# Patient Record
Sex: Female | Born: 1945 | Race: White | Hispanic: No | Marital: Married | Smoking: Former smoker
Health system: Southern US, Community
[De-identification: ages and names within clinical notes are randomized; demographics above are authoritative.]

## PROBLEM LIST (undated history)

## (undated) DIAGNOSIS — I2510 Atherosclerotic heart disease of native coronary artery without angina pectoris: Secondary | ICD-10-CM

## (undated) DIAGNOSIS — I10 Essential (primary) hypertension: Secondary | ICD-10-CM

## (undated) DIAGNOSIS — M5126 Other intervertebral disc displacement, lumbar region: Secondary | ICD-10-CM

## (undated) DIAGNOSIS — J302 Other seasonal allergic rhinitis: Secondary | ICD-10-CM

## (undated) DIAGNOSIS — K5900 Constipation, unspecified: Secondary | ICD-10-CM

## (undated) DIAGNOSIS — K219 Gastro-esophageal reflux disease without esophagitis: Secondary | ICD-10-CM

## (undated) DIAGNOSIS — M62830 Muscle spasm of back: Secondary | ICD-10-CM

## (undated) DIAGNOSIS — I639 Cerebral infarction, unspecified: Secondary | ICD-10-CM

## (undated) DIAGNOSIS — R51 Headache: Secondary | ICD-10-CM

## (undated) HISTORY — PX: COLONOSCOPY: SHX174

## (undated) HISTORY — PX: POSTERIOR LAMINECTOMY / DECOMPRESSION CERVICAL SPINE: SUR739

## (undated) HISTORY — PX: EYE SURGERY: SHX253

---

## 2001-07-03 ENCOUNTER — Ambulatory Visit (HOSPITAL_COMMUNITY): Admission: EM | Admit: 2001-07-03 | Discharge: 2001-07-03 | Payer: Self-pay | Admitting: Obstetrics and Gynecology

## 2001-07-03 ENCOUNTER — Encounter: Payer: Self-pay | Admitting: Obstetrics and Gynecology

## 2001-08-25 ENCOUNTER — Ambulatory Visit (HOSPITAL_COMMUNITY): Admission: RE | Admit: 2001-08-25 | Discharge: 2001-08-25 | Payer: Self-pay | Admitting: Family Medicine

## 2001-08-25 ENCOUNTER — Encounter: Payer: Self-pay | Admitting: Family Medicine

## 2001-09-11 ENCOUNTER — Encounter (HOSPITAL_COMMUNITY): Admission: RE | Admit: 2001-09-11 | Discharge: 2001-10-11 | Payer: Self-pay | Admitting: Neurological Surgery

## 2002-05-20 ENCOUNTER — Ambulatory Visit (HOSPITAL_COMMUNITY): Admission: RE | Admit: 2002-05-20 | Discharge: 2002-05-20 | Payer: Self-pay | Admitting: Internal Medicine

## 2002-05-20 ENCOUNTER — Encounter: Payer: Self-pay | Admitting: Internal Medicine

## 2002-07-08 ENCOUNTER — Encounter: Payer: Self-pay | Admitting: Obstetrics and Gynecology

## 2002-07-08 ENCOUNTER — Ambulatory Visit (HOSPITAL_COMMUNITY): Admission: RE | Admit: 2002-07-08 | Discharge: 2002-07-08 | Payer: Self-pay | Admitting: Obstetrics and Gynecology

## 2002-09-30 ENCOUNTER — Encounter: Payer: Self-pay | Admitting: Internal Medicine

## 2002-09-30 ENCOUNTER — Ambulatory Visit (HOSPITAL_COMMUNITY): Admission: RE | Admit: 2002-09-30 | Discharge: 2002-09-30 | Payer: Self-pay | Admitting: Internal Medicine

## 2003-04-16 ENCOUNTER — Encounter: Payer: Self-pay | Admitting: Internal Medicine

## 2003-04-16 ENCOUNTER — Encounter (HOSPITAL_COMMUNITY): Admission: RE | Admit: 2003-04-16 | Discharge: 2003-05-16 | Payer: Self-pay | Admitting: Internal Medicine

## 2004-06-27 ENCOUNTER — Ambulatory Visit (HOSPITAL_COMMUNITY): Admission: RE | Admit: 2004-06-27 | Discharge: 2004-06-27 | Payer: Self-pay | Admitting: Internal Medicine

## 2005-01-25 ENCOUNTER — Ambulatory Visit (HOSPITAL_COMMUNITY): Admission: RE | Admit: 2005-01-25 | Discharge: 2005-01-25 | Payer: Self-pay | Admitting: Family Medicine

## 2005-04-27 ENCOUNTER — Emergency Department (HOSPITAL_COMMUNITY): Admission: EM | Admit: 2005-04-27 | Discharge: 2005-04-27 | Payer: Self-pay | Admitting: Emergency Medicine

## 2005-11-29 ENCOUNTER — Ambulatory Visit (HOSPITAL_COMMUNITY): Admission: RE | Admit: 2005-11-29 | Discharge: 2005-11-29 | Payer: Self-pay | Admitting: Family Medicine

## 2005-12-07 ENCOUNTER — Encounter (HOSPITAL_COMMUNITY): Admission: RE | Admit: 2005-12-07 | Discharge: 2006-01-06 | Payer: Self-pay | Admitting: Neurological Surgery

## 2006-03-06 ENCOUNTER — Ambulatory Visit (HOSPITAL_COMMUNITY): Admission: RE | Admit: 2006-03-06 | Discharge: 2006-03-06 | Payer: Self-pay | Admitting: Internal Medicine

## 2006-06-11 ENCOUNTER — Ambulatory Visit (HOSPITAL_COMMUNITY): Admission: RE | Admit: 2006-06-11 | Discharge: 2006-06-12 | Payer: Self-pay | Admitting: Neurological Surgery

## 2006-11-08 ENCOUNTER — Ambulatory Visit (HOSPITAL_COMMUNITY): Admission: RE | Admit: 2006-11-08 | Discharge: 2006-11-08 | Payer: Self-pay | Admitting: Family Medicine

## 2006-11-14 ENCOUNTER — Ambulatory Visit (HOSPITAL_COMMUNITY): Admission: RE | Admit: 2006-11-14 | Discharge: 2006-11-14 | Payer: Self-pay | Admitting: Family Medicine

## 2006-11-27 HISTORY — PX: CATARACT EXTRACTION, BILATERAL: SHX1313

## 2007-03-19 ENCOUNTER — Ambulatory Visit (HOSPITAL_COMMUNITY): Admission: RE | Admit: 2007-03-19 | Discharge: 2007-03-19 | Payer: Self-pay | Admitting: Internal Medicine

## 2007-12-12 ENCOUNTER — Other Ambulatory Visit: Admission: RE | Admit: 2007-12-12 | Discharge: 2007-12-12 | Payer: Self-pay | Admitting: Obstetrics & Gynecology

## 2008-03-19 ENCOUNTER — Ambulatory Visit (HOSPITAL_COMMUNITY): Admission: RE | Admit: 2008-03-19 | Discharge: 2008-03-19 | Payer: Self-pay | Admitting: Internal Medicine

## 2008-11-27 DIAGNOSIS — I639 Cerebral infarction, unspecified: Secondary | ICD-10-CM

## 2008-11-27 HISTORY — DX: Cerebral infarction, unspecified: I63.9

## 2008-12-28 HISTORY — PX: CARDIAC CATHETERIZATION: SHX172

## 2008-12-30 ENCOUNTER — Observation Stay (HOSPITAL_COMMUNITY): Admission: EM | Admit: 2008-12-30 | Discharge: 2009-01-01 | Payer: Self-pay | Admitting: Emergency Medicine

## 2009-01-04 ENCOUNTER — Ambulatory Visit (HOSPITAL_COMMUNITY): Admission: RE | Admit: 2009-01-04 | Discharge: 2009-01-04 | Payer: Self-pay | Admitting: Cardiovascular Disease

## 2009-01-06 ENCOUNTER — Ambulatory Visit (HOSPITAL_COMMUNITY): Admission: RE | Admit: 2009-01-06 | Discharge: 2009-01-06 | Payer: Self-pay | Admitting: Cardiovascular Disease

## 2009-05-24 ENCOUNTER — Ambulatory Visit (HOSPITAL_COMMUNITY): Admission: RE | Admit: 2009-05-24 | Discharge: 2009-05-24 | Payer: Self-pay | Admitting: Family Medicine

## 2009-07-13 ENCOUNTER — Observation Stay (HOSPITAL_COMMUNITY): Admission: EM | Admit: 2009-07-13 | Discharge: 2009-07-14 | Payer: Self-pay | Admitting: Emergency Medicine

## 2009-07-14 ENCOUNTER — Encounter (INDEPENDENT_AMBULATORY_CARE_PROVIDER_SITE_OTHER): Payer: Self-pay | Admitting: Family Medicine

## 2010-08-22 ENCOUNTER — Ambulatory Visit (HOSPITAL_COMMUNITY): Admission: RE | Admit: 2010-08-22 | Discharge: 2010-08-22 | Payer: Self-pay | Admitting: Obstetrics & Gynecology

## 2010-08-22 ENCOUNTER — Ambulatory Visit (HOSPITAL_COMMUNITY): Admission: RE | Admit: 2010-08-22 | Discharge: 2010-08-22 | Payer: Self-pay | Admitting: Obstetrics and Gynecology

## 2011-03-04 LAB — CBC
HCT: 40.6 % (ref 36.0–46.0)
HCT: 42.5 % (ref 36.0–46.0)
Hemoglobin: 14.2 g/dL (ref 12.0–15.0)
MCHC: 34.4 g/dL (ref 30.0–36.0)
MCV: 87.5 fL (ref 78.0–100.0)
RBC: 4.58 MIL/uL (ref 3.87–5.11)
RDW: 13.1 % (ref 11.5–15.5)
WBC: 7.6 10*3/uL (ref 4.0–10.5)
WBC: 7.7 10*3/uL (ref 4.0–10.5)

## 2011-03-04 LAB — BASIC METABOLIC PANEL
GFR calc non Af Amer: >60 mL/min (ref >60)
Potassium: 3.6 mEq/L (ref 3.5–5.1)
Sodium: 139 mEq/L (ref 135–145)

## 2011-03-04 LAB — COMPREHENSIVE METABOLIC PANEL
Alkaline Phosphatase: 88 U/L (ref 39–117)
CO2: 33 mEq/L — ABNORMAL HIGH (ref 19–32)
Chloride: 97 mEq/L (ref 96–112)
GFR calc Af Amer: >60 mL/min (ref >60)
Potassium: 4.7 mEq/L (ref 3.5–5.1)
Total Bilirubin: 0.7 mg/dL (ref 0.3–1.2)

## 2011-03-04 LAB — CARDIAC PANEL(CRET KIN+CKTOT+MB+TROPI)
CK, MB: 1 ng/mL (ref 0.3–4.0)
CK, MB: 1.2 ng/mL (ref 0.3–4.0)
Relative Index: INVALID (ref 0.0–2.5)
Relative Index: INVALID (ref 0.0–2.5)
Total CK: 77 U/L (ref 7–177)
Total CK: 99 U/L (ref 7–177)
Troponin I: 0.02 ng/mL (ref 0.00–0.06)
Troponin I: 0.03 ng/mL (ref 0.00–0.06)

## 2011-03-04 LAB — DIFFERENTIAL
Basophils Relative: 1 % (ref 0–1)
Eosinophils Absolute: 0.3 10*3/uL (ref 0.0–0.7)
Eosinophils Relative: 3 % (ref 0–5)
Eosinophils Relative: 3 % (ref 0–5)
Lymphocytes Relative: 37 % (ref 12–46)
Lymphocytes Relative: 42 % (ref 12–46)
Lymphs Abs: 3.2 10*3/uL (ref 0.7–4.0)
Monocytes Absolute: 0.8 10*3/uL (ref 0.1–1.0)
Monocytes Absolute: 0.8 10*3/uL (ref 0.1–1.0)
Monocytes Relative: 10 % (ref 3–12)

## 2011-03-04 LAB — GLUCOSE, CAPILLARY

## 2011-03-04 LAB — POCT CARDIAC MARKERS: CKMB, poc: <1 ng/mL — ABNORMAL LOW (ref 1.0–8.0)

## 2011-03-04 LAB — LIPID PANEL
Cholesterol: 267 mg/dL — ABNORMAL HIGH (ref 0–200)
Total CHOL/HDL Ratio: 4.1 RATIO

## 2011-03-14 LAB — CBC
HCT: 40.7 % (ref 36.0–46.0)
Hemoglobin: 13.9 g/dL (ref 12.0–15.0)
MCHC: 33.2 g/dL (ref 30.0–36.0)
MCV: 87.3 fL (ref 78.0–100.0)
Platelets: 250 10*3/uL (ref 150–400)
Platelets: 275 10*3/uL (ref 150–400)
RBC: 5.04 MIL/uL (ref 3.87–5.11)
WBC: 7.5 10*3/uL (ref 4.0–10.5)

## 2011-03-14 LAB — POCT I-STAT, CHEM 8
BUN: 17 mg/dL (ref 6–23)
Calcium, Ion: 1.23 mmol/L (ref 1.12–1.32)
Chloride: 102 mEq/L (ref 96–112)
Creatinine, Ser: 0.9 mg/dL (ref 0.4–1.2)
Glucose, Bld: 89 mg/dL (ref 70–99)
HCT: 45 % (ref 36.0–46.0)
Hemoglobin: 15.3 g/dL — ABNORMAL HIGH (ref 12.0–15.0)
Potassium: 3.8 mEq/L (ref 3.5–5.1)
Sodium: 141 mEq/L (ref 135–145)
TCO2: 31 mmol/L (ref 0–100)

## 2011-03-14 LAB — POCT CARDIAC MARKERS
CKMB, poc: 1.1 ng/mL (ref 1.0–8.0)
Myoglobin, poc: 53.8 ng/mL (ref 12–200)
Troponin i, poc: <0.05 ng/mL (ref 0.00–0.09)

## 2011-03-14 LAB — CARDIAC PANEL(CRET KIN+CKTOT+MB+TROPI)
CK, MB: 1.7 ng/mL (ref 0.3–4.0)
Relative Index: 1.6 (ref 0.0–2.5)
Total CK: 92 U/L (ref 7–177)

## 2011-03-14 LAB — PROTIME-INR
INR: 0.9 (ref 0.00–1.49)
Prothrombin Time: 12.2 seconds (ref 11.6–15.2)

## 2011-03-14 LAB — DIFFERENTIAL
Basophils Absolute: 0.1 10*3/uL (ref 0.0–0.1)
Basophils Relative: 1 % (ref 0–1)
Monocytes Relative: 10 % (ref 3–12)
Neutro Abs: 4.6 10*3/uL (ref 1.7–7.7)
Neutrophils Relative %: 55 % (ref 43–77)

## 2011-03-14 LAB — BASIC METABOLIC PANEL
BUN: 14 mg/dL (ref 6–23)
Chloride: 101 mEq/L (ref 96–112)
Glucose, Bld: 116 mg/dL — ABNORMAL HIGH (ref 70–99)
Potassium: 3.5 mEq/L (ref 3.5–5.1)
Sodium: 137 mEq/L (ref 135–145)

## 2011-03-14 LAB — METANEPHRINES, PLASMA
Metanephrine, Free: <25 pg/mL (ref <=57)
Normetanephrine, Free: 120 pg/mL (ref <=148)
Total Metanephrines-Plasma: 120 pg/mL (ref <=205)

## 2011-03-14 LAB — MAGNESIUM: Magnesium: 2.3 mg/dL (ref 1.5–2.5)

## 2011-03-14 LAB — LIPID PANEL
Cholesterol: 230 mg/dL — ABNORMAL HIGH (ref 0–200)
HDL: 54 mg/dL (ref >39)
Triglycerides: 255 mg/dL — ABNORMAL HIGH (ref <150)

## 2011-04-11 NOTE — Cardiovascular Report (Signed)
NAME:  ANALYSSE, QUINONEZ            ACCOUNT NO.:  [id number]   MEDICAL RECORD NO.:  [id number]          PATIENT TYPE:  OIB   LOCATION:  2899                         FACILITY:  [facility]   PHYSICIAN:  Nanetta Batty, M.D.   DATE OF BIRTH:  [birth year]   DATE OF PROCEDURE:  01/06/2009  DATE OF DISCHARGE:  01/06/2009                            CARDIAC CATHETERIZATION   HISTORY:  Ms. 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  She was last seen by Dr. Domingo Sep in 2003, at which time a  stress test was read as low risk nonischemic.  Other problems include  hypertension and hyperlipidemia.  She was recently seen at [facility]  for chest pain and presyncope.  A Myoview stress test was negative for  ischemia.  She believes that her symptoms are related to her statin drug  which she has stopped on her own and she feels clinically improved.   DESCRIPTION OF PROCEDURE:  The patient was brought to the Second Floor  [facility] Cardiac Cath Lab in a postabsorptive state.  She was  premedicated with p.o. Valium.  Right groin was prepped and shaved in  the usual sterile fashion.  Xylocaine 1% was used for local anesthesia.  A 6-French sheath was inserted into the right femoral artery using  standard Seldinger technique.  A 6-French right-left Judkins diagnostic  catheter as well as a 6-French pigtail catheter were used for selective  coronary angiography and left ventriculography respectively.  Visipaque  dye was used for the entirety of the case.  Retrograde aortic, left  ventricular, and pullback pressures were recorded.   HEMODYNAMIC RESULTS:  1. Aortic systolic pressure 130, diastolic pressure 56.  2. Left ventricular systolic pressure 126, end-diastolic pressure 5.   SELECTIVE CHOLANGIOGRAPHY:  1. Left main normal.  2. LAD normal.  3. Left circumflex normal.  4. Ramus intermedius branch which  arose as a high marginal was      moderately sized and had a 70% fairly focal proximal stenosis.  5. Right coronary was dominant normal.  6. Left ventriculography; RAO left ventriculogram was performed using      25 mL of Visipaque dye at 12 mL per second.  The overall LVEF was      estimated at greater than 60% without focal wall motion      abnormalities.   IMPRESSION:  Ms. 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  I do not think this is contributing to her symptoms.  Continued medical  therapy will be recommended.   The sheath was removed and pressure was held on the groin to achieve  hemostasis.  The patient left the lab in stable condition.  She will be  discharged home later today as an outpatient and will see me back in the  office 1-2 weeks for followup.      Nanetta Batty, M.D.  Electronically Signed     JB/MEDQ  D:  01/06/2009  T:  01/06/2009  Job:  [id number]   cc:   Second [facility]  [facility]  Madelin Rear. Sherwood Gambler, MD

## 2011-04-11 NOTE — Discharge Summary (Signed)
NAME:  Lisa Griffith, Lisa Griffith            ACCOUNT NO.:  [id number]   MEDICAL RECORD NO.:  [id number]          PATIENT TYPE:  OBV   LOCATION:  2041                         FACILITY:  [facility]   PHYSICIAN:  Sheliah Mends, MD      DATE OF BIRTH:  [birth year]   DATE OF ADMISSION:  12/30/2008  DATE OF DISCHARGE:  01/01/2009                               DISCHARGE SUMMARY   DISCHARGE DIAGNOSES:  1. Chest pain, abnormal stress test with a negative nuclear images      this admission, plan is for medical therapy.  2. Treated hypertension.  3. Treated dyslipidemia.  4. Past history of smoking.  5. 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   HOSPITAL COURSE:  The patient is a 65 year old female who was  transferred from Dr. Sharyon Medicus office on December 30, 2008, with  hypertension, dizziness, and chest pain.  She has no prior history of  coronary disease.  She does have a long history of hypertension and is a  prior smoker, although she quit 20 years ago.  The patient was admitted  by Dr. Garen Lah.  She was started on beta-blocker and her ACE inhibitor  was increased, and she was put on aspirin, Lovenox, and PPI.  Troponins  were negative x3.  Cholesterol was 230, triglycerides 255, HDL 54, LDL  125.  She underwent a Lexiscan Myoview, which was abnormal with some ST  depression in II, III, and AVF and some chest pain.  Dr. Tresa Endo reviewed  the perfusion images and felt these were all negative for ischemia, and  she had good LV function.  Plan for now is for aggressive medical  therapy and risk factor modification.  Dr. Garen Lah will see her back in  [place].   LABORATORY DATA:  White count 7.5, hemoglobin 13.9, hematocrit 40.7,  platelets 250.  Sodium 137, potassium 3.5, BUN 14, creatinine 0.63.  Troponins were negative x4.  Hemoglobin A1c is 6.1.  TSH is 1.50.  LFTs  were normal.  Nuclear study showed no ischemia with an EF of 86%.  EKG  shows sinus rhythm without  acute changes.   DISPOSITION:  The patient is discharged in stable condition on the  following medications.  She will follow up with Dr. Garen Lah in  [place].   DISCHARGE MEDICATIONS:  1. Coreg 6.25 mg b.i.d.  2. Lisinopril 20 mg a day.  3. Aspirin 81 mg a day.  4. Pepcid 20 mg a day.  5. Multivitamin daily.  6. Crestor 20 mg a day.  7. Norvasc 5 mg a day.  8. Nitroglycerin sublingual p.r.n.      Abelino Derrick, P.A.      Sheliah Mends, MD  Electronically Signed    LKK/MEDQ  D:  01/01/2009  T:  01/01/2009  Job:  [id number]   cc:   Madelin Rear. Sherwood Gambler, MD

## 2011-04-11 NOTE — H&P (Signed)
NAME:  Lisa Griffith, Lisa Griffith            ACCOUNT NO.:  [id number]   MEDICAL RECORD NO.:  [id number]          PATIENT TYPE:  INP   LOCATION:  A332                          FACILITY:  [facility]   PHYSICIAN:  Thad Ranger, MD       DATE OF BIRTH:  1946-10-24   DATE OF ADMISSION:  07/13/2009  DATE OF DISCHARGE:  LH                              HISTORY & PHYSICAL   PRIMARY CARE PHYSICIAN:  Madelin Rear. Sherwood Gambler, MD   CHIEF COMPLAINT:  Facial numbness.   HISTORY OF PRESENT ILLNESS:  Ms. Bradshaw is a 65 year old female with  history of hypertension, hyperlipidemia, prior history of TIA, presented  to [facility] ER with chief complaint of facial numbness.  History was  provided by the patient.  She stated that the facial numbness started  all of a sudden at 5 a.m. yesterday when she was going to her work.  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  The facial numbness remained  episodic for the whole day and every episode will last for approximately  15 minutes.  She had multiple episodes yesterday and the course was  persistent.  She had 3 episodes this morning, last episode was 10 a.m.  this morning.  The numbness is located in the left side of the face and  she also felt numbness in her left hand.  She had no associated symptoms  or difficulty speaking, difficulty swallowing, confusion, dizziness,  headache, lightheadedness, or syncopal episode.  She had no gait  abnormality or any slurring of speech.  She did have some left eye pain.  Otherwise, she denied any chest pain, palpitations, any diaphoresis,  nausea, or vomiting.  She had 1 prior history of TIA 5 years ago.  The  patient has been on aspirin for more than a year.  She took the aspirin  yesterday and this morning as well, however, her symptoms persisted.   REVIEW OF SYSTEMS:  Ten-point review of system is negative except  otherwise dictated in the above HPI.   PAST MEDICAL HISTORY:  1. Hypertension.  2. Hyperlipidemia.  3. Chronic  back pain.  4. History of TIA 5 years ago.  5. History of breast cancer.   PAST SURGICAL HISTORY:  Cataract history and cervical disc surgery.   SOCIAL HISTORY:  The patient drinks alcohol occasionally.  She is a  prior smoker, however, quit 20 years ago.  Denies any drug use.  She  currently lives at home with her family and is functional with her ADLs.   DRUG ALLERGY:  No known drug allergies.   MEDICATIONS PRIOR TO ADMISSION:  The patient does not remember any of  her medications at this time; however, she states that she takes 81 mg  aspirin.   PHYSICAL EXAMINATION:  VITAL SIGNS:  Temperature 98.2, blood pressure  146/60, pulse 69, respiratory rate 18, and O2 sat is 97% on room air.  GENERAL:  The patient is alert, awake, and oriented x3; not in any acute  distress.  HEENT:  Anicteric sclerae, pink conjunctivae.  Pupils are reactive to  light and accommodation.  EOMI.  No obvious facial drooping or any  dysarthria noted.  NECK:  Supple.  No lymphadenopathy.  No JVD.  CVS:  S1 and S2 clear.  Regular rate and rhythm.  No murmur, rubs, or  gallops.  CHEST:  Clear to auscultation bilaterally.  No wheezing, rales, or  rhonchi.  ABDOMEN:  Nontender, nondistended.  Normal bowel sounds.  EXTREMITIES:  No cyanosis, clubbing, or edema noted in the upper or  lower extremities.  NEURO:  Alert, awake, and oriented x3.  Cranial nerves II-XII intact.  Motor strength intact 5/5 in all extremities.  Sensation is normal.  Gait not assessed.  SKIN:  No rashes or any decubitus noted.  GU:  No Foley or any CV angle tenderness noted.  PSYCHIATRIC:  Normal; alert, awake, and oriented x4.   LABORATORY DIAGNOSTIC DATA:  WBC 7.7, hemoglobin 14.6, hematocrit 42.5,  and platelets 284.  Sodium 137, potassium 4.7, bicarb 33, BUN and  creatinine 12 and 0.6.  LFTs are essentially normal except ALT slightly  high at 38, albumin 3.8, and calcium 10.0.  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  The patient had a cardiac catheterization in February  2010 for chest pain, which showed moderate stenosis in the ramus branch  and was recommended continued medical therapy.   IMPRESSION AND PLAN:  Ms. Kinzie is a 65 year old female with history  of hypertension, hyperlipidemia, and prior history of transient ischemic  attack,  presents with symptoms of facial numbness which has been  persistent over 24 hours and episodic with some numbness in the left  upper extremity qualifies for acute cerebrovascular accident per  American Stroke Association Guidelines.  ABCD score is 4, which meets  the inpatient criteria, and the patient is also noted to be on aspirin  prior to the admission.  1. Acute cerebrovascular accident.  The patient will be admitted to [facility]      tele medical floor and I will also obtain cardiac enzymes, fasting      lipid panel, TSH, and EKG for workup.  The patient will be      continued on aspirin, and I will also add Plavix for double      antiplatelet therapy.  I will obtain MRI/MRA of the head, carotid      Dopplers, and 2-D echocardiogram for further workup.  Neurology      consult will be obtained.  The patient at present does not have any      neurological deficits and she has passed the swallow evaluation in      the ER.  2. Hypertension.  I will restart lisinopril, however, no aggressive      reduction of BP at this point.  The patient does not have the list      of  the antihypertensives.  If BP more than 180, we will restart      the patient on Norvasc and Coreg per her outpatient dose.  The      patient will be monitored closely on tele monitor.  3. Hyperlipidemia.  I will obtain fasting lipid profile and the      patient will be started on the statins.  4. Prophylaxis.  The patient will be started on heparin for deep      venous thrombosis prophylaxis.  5. Code status, full  code.      Thad Ranger, MD  Electronically Signed     RR/MEDQ  D:  07/13/2009  T:  07/14/2009  Job:  [id number]   cc:   Madelin Rear. Sherwood Gambler, MD  Fax: [phone]

## 2011-04-14 NOTE — Discharge Summary (Signed)
NAME:  LAVORA, BRISBON            ACCOUNT NO.:  [id number]   MEDICAL RECORD NO.:  [id number]          PATIENT TYPE:  OBV   LOCATION:  A332                          FACILITY:  [facility]   PHYSICIAN:  Thad Ranger, MD       DATE OF BIRTH:  1946-01-26   DATE OF ADMISSION:  07/13/2009  DATE OF DISCHARGE:  08/18/2010LH                               DISCHARGE SUMMARY   DISCHARGE DIAGNOSES:  1. Acute cerebrovascular accident.  2. Hypertension.   HISTORY OF PRESENT ILLNESS AT THE TIME OF ADMISSION:  Ms. Brashears is a  65 year old female with a history of hypertension, hyperlipidemia, prior  history of TIA, presented with chief complaint of facial numbness.  She  stated that the facial numbness started all of a sudden at 5 a.m. a day  prior to admission when she was going to her work.  Facial numbness  remained episodic for the whole day and every episode lasted  approximately 15 minutes.  She had multiple episodes the day prior to  admission and of course was persistent.  She had 3 episodes on the day  of admission, and she was came to ER for evaluation.  She also noticed  the numbness in her left hand and the numbness was located on the left  side of the face.  She had no associated symptoms of difficulty  speaking, swallowing, confusion, dizziness, lightheadedness, or any  syncopal episode.  She had no gait abnormalities or any slurring speech.  She did complain of some left eye pain.  She had one prior history of  TIA 5 years ago, and the patient has been on aspirin for more than a  year.   PAST MEDICAL HISTORY:  1. Hypertension.  2. Hyperlipidemia.  3. Chronic back pain.  4. History of TIA 5 years ago.  5. History of breast cancer.   PHYSICAL EXAMINATION AT THE TIME OF ADMISSION:  VITAL SIGNS:  Temperature 98.2, blood pressure 146/60, pulse 69, respiratory rate 18,  and O2 sats 97% on room air.  Physical examination was essentially  benign.  NEUROLOGIC:  The patient was alert,  awake, and oriented x3.  Cranial  nerves II through XII intact.  Motor strength intact 5/5 in all  extremities.  Sensation normal.  Gait was not assessed.   LABORATORY AND DIAGNOSTIC DATA:  CBC and BMET were essentially normal.  LFTs were normal except ALT was slightly likely high at 38 and albumin  3.8.  CT head was done showed no acute intracranial finding, chronic  lacunar infarct of the left putamen.  During hospitalization, she had  MRI and MRA which were negative.  MRI showed no acute intracranial  abnormality mild-to-moderate for age nonspecific cerebral white matter  disease mostly commonly due to small vessel ischemia.  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  Estimated degree of stenosis in  internal carotid artery is less than 50%.  Echocardiogram showed EF of  55-60% with normal wall motion, no regional wall motion  abnormalities,  no aortic stenosis was noted.   BRIEF HOSPITALIZATION COURSE:  Ms. Murphey is a 65 year old female  with history of hypertension and hyperlipidemia presented with left  facial numbness and numbness of the left hand.  However, she did not  have any associated symptoms of difficulty speaking, swallowing, or any  confusion.  She had a prior history of TIA.  The patient was admitted  with diagnosis of acute CVA due to persistent symptoms over 24 hours.  The patient was admitted to medical floor on telemonitor.  MRI and MRA  was done which was essentially normal.  Carotid ultrasound was done  which showed less than 50% stenosis in internal carotid artery.  Echocardiogram has results above was essentially normal with no wall  motion abnormalities.  No aortic stenosis.  As the patient had been on  aspirin prior to this episode and due to recurrent history of TIA, she  was started on Plavix with aspirin  for double antiplatelet therapy.  The  patient was continued on beta-blocker, lisinopril, and Norvasc for  hypertension control.  She was continued on statins per her outpatient  medication profile.   DISCHARGE MEDICATIONS:  New medications started was,  1. Plavix 75 mg daily.  2. Continue aspirin 81 mg daily.  3. Coreg 6.25 mg b.i.d.  4. Lisinopril 20 mg daily.  5. Norvasc 5 mg daily.  6. Crestor daily.   DISCHARGE INSTRUCTIONS:  The patient is to follow up with her PCP and  Dr. Sherwood Gambler in 2 weeks.   DISCHARGE TIME:  25 minutes.      Thad Ranger, MD  Electronically Signed     RR/MEDQ  D:  07/15/2009  T:  07/15/2009  Job:  [id number]

## 2011-04-14 NOTE — Op Note (Signed)
NAME:  Lisa Griffith, Lisa Griffith            ACCOUNT NO.:  [id number]   MEDICAL RECORD NO.:  [id number]          PATIENT TYPE:  INP   LOCATION:  3027                         FACILITY:  [facility]   PHYSICIAN:  Stefani Dama, M.D.  DATE OF BIRTH:  September 27, 1946   DATE OF PROCEDURE:  06/11/2006  DATE OF DISCHARGE:                                 OPERATIVE REPORT   PREOPERATIVE DIAGNOSES:  Cervical spondylosis with severe radiculopathy and  chronic neck pain.   POSTOPERATIVE DIAGNOSES:  Cervical spondylosis with severe radiculopathy and  chronic neck pain.   PROCEDURES:  1.  Anterior cervical decompression, C4-5, C5-6 and C6-7.  2.  Arthrodesis with structural allograft PEAK bone spacers, C4-5, C5-6 and      C6-7.  3.  Anterior plate fixation, C4-C7 with AlphaTek locking plate.   SURGEON:  Stefani Dama, M.D.   FIRST ASSISTANT:  Clydene Fake, M.D.   ANESTHESIA:  General endotracheal.   INDICATIONS:  Lisa Griffith is a 65 year old individual who as had  significant neck, shoulder and arm pain.  She has had profound spondylitic  changes at C4-5, C5-6 and C6-7.  These have been followed for some time, and  despite efforts at conservative management the patient notes that the pain  has been becoming increasingly refractory.  After careful consideration of  her options,  noting that she was having bilateral dysesthesias into the  upper extremities nearly constantly, she was advised regarding surgical  decompression and arthrodesis at C4-5, C5-6 and C6-7.   PROCEDURE:  The patient was brought to the operating room supine on the  stretcher.  After the smooth induction of general endotracheal anesthesia,  she was placed in 5 pounds of Holter traction.  The neck was draped  sterilely after prepping with DuraPrep.  A transverse incision was created  in the mid portion of the neck, after infiltrating the skin with 1%  lidocaine with epinephrine for a total of 10 cc.  Dissection was carried  down through the platysma.  The plane between the sternocleidomastoid and  the strap muscles was dissected bluntly until the prevertebral space was  reached.  The first identifiable disk space was noted be that of C3-C4,  localized on an x-ray with a needle placed within it.  The dissection was  carried down at C4-5, C5-6 and C6-7.  The longus coli muscle was stripped  off each side of midline to allow placement of a large Caspar retractor.  The Caspar retractor was used to maintain distraction, to expose the disk  space at C4-5 and C5-6.  A #15 blade was used to open anterior longitudinal  ligament over a prominent ventral osteophyte at the C4-C5 level.  A  combination of Kerrison rongeurs was then used to evacuate a significant  quantity of severely degenerated disk material from within the disk space.  Once the disk material was evacuated, a series of curets were used to remove  endplate material; and with the disk space opening being enlarged, a self-  retaining disk spreader was placed in the wound on one side.  This allowed  for identification of the  osteophytes, with a large prominent osteophyte  from the inferior margin the body at C4, and a significant uncinate spur on  the right lateral margin at C5.  These were drilled down with a high-speed  drill and a 2.3 mm dissecting tool.  Dissection was then carried out to the  posterior longitudinal ligament.  The ligament was taken up with the 2 mm  Kerrison punch.  Dissection was carried out laterally to expose the takeoff  of the nerve root.  The uncinate process spur was removed in a similar  fashion.  This was cut into smaller fragments of bone and saved for bone  graft.  The left-sided decompression was undertaken in the same way.  Hemostasis from the epidural space was achieved with some small pledgets of  Gelfoam soaked in thrombin, which were later irrigated away.  The interspace  was then sized, and it was felt that a 7 mm  medium-sized PEAK spacer would  fit nicely into this interspace.  The edges of the vertebrae were smoothed  with a 4 mm diameter barrel bit, and then the PEAK spacer filled with a  combination of demineralized bone matrix mixed with the autograft of the  patient's own bone.  This was placed into the interspace.   Attention was then turned to C5-6, where a similar procedure was carried  out.  Here the bone spurs were noted to be more prominent,  particularly off  the inferior margin of the body of C5.  The uncinate spurs were also large,  and these were drilled down and removed in an en bloc fashion-- first from  the right side, then on the left side.  Once decompression was achieved, a  similar spacing incising technique was used; and again a medium sized 7 mm  PEAK spacer was filled with a combination of autograft and demineralized  bone matrix.  This was placed into the interspace.   Attention was then turned to C6-C7, and with the use of the operating  microscope and a microdissection technique, the anterior longitudinal  ligament was opened in a similar fashion.  The diskectomy was undertaken.  Bone spur was noted to be much more prominent from the vertebral body of C7.  Uncinate spurs were also moderately sized; these were drilled away in an en  bloc fashion,  and again removed to allow good decompression of the C7 nerve  roots -- first on the right side and then on the left side.  Once hemostasis  was achieved adequately in these interspaces, interspace was sized and it  was felt that a 7 mm medium sized PEAK spacer would fit nicely.  This was  filled with a combination of autograft and demineralized bone matrix.  Once  the grafts were placed, traction was removed.  The ventral aspects of the  vertebra were inspected, and any residual bone spurs were removed.  Ultimately a 54 mm standard size AlphaTek plate was fitted to the ventral aspect of vertebral bodies; with 4 x 14 mm locking  screws being placed in C5  and C6.  Variable angle screws measuring 4 x 14 mm were placed in C4 and C7.  Once the screws were placed, the wound was checked for hemostasis.  A  localizing radiograph identified good position of the hardware.  After  careful re-examination of the wound, checking carefully for hemostasis in  the prevertebral space and in the soft tissues ventral to the neck, the  wound was closed with 3-0 Vicryl  in the inverted interrupted fashion.  The  platysma and 3-0 Vicryl subcuticularly.  Dermabond was then placed on the  skin.  The patient tolerated the procedure well and was returned to the  recovery room in stable condition.      Stefani Dama, M.D.  Electronically Signed     HJE/MEDQ  D:  06/11/2006  T:  06/11/2006  Job:  [id number]

## 2011-07-17 ENCOUNTER — Other Ambulatory Visit (HOSPITAL_COMMUNITY): Payer: Self-pay | Admitting: Family Medicine

## 2011-07-17 DIAGNOSIS — Z139 Encounter for screening, unspecified: Secondary | ICD-10-CM

## 2011-08-25 ENCOUNTER — Ambulatory Visit (HOSPITAL_COMMUNITY)
Admission: RE | Admit: 2011-08-25 | Discharge: 2011-08-25 | Disposition: A | Discharge status: Home / Self Care | Payer: BC Managed Care – PPO | Source: Ambulatory Visit | Attending: Family Medicine | Admitting: Family Medicine

## 2011-08-25 DIAGNOSIS — Z139 Encounter for screening, unspecified: Secondary | ICD-10-CM

## 2011-08-25 DIAGNOSIS — Z1231 Encounter for screening mammogram for malignant neoplasm of breast: Secondary | ICD-10-CM | POA: Insufficient documentation

## 2012-01-02 NOTE — H&P (Signed)
  NTS SOAP Note  Vital Signs:  Vitals as of: 01/02/2012: Systolic 158: Diastolic 75: Heart Rate 76: Temp 96.41F: Height 60ft 2in: Weight 187Lbs 0 Ounces: OFC 0in: Respiratory Rate 0: O2 Saturation 0: Pain Level 0: BMI 34  BMI : 34.2 kg/m2  Subjective: This 1 Years 1 Months old Female presents forTCS.  Never has had one.  Denies GI complaints.  Review of Symptoms:  Constitutional:unremarkable Head:unremarkable Eyes:unremarkable Nose/Mouth/Throat:unremarkable Cardiovascular:unremarkable Respiratory:unremarkable Gastrointestinheartburn Genitourinary:unremarkable back pain Skin:unremarkable Hematolgic/Lymphatic:unremarkable Allergic/Immunologic:unremarkable   Past Medical History:Reviewed   Past Medical History  Surgical History: unremarkable Medical Problems:  High Blood pressure Allergies: nkda Medications: lisinopril/HCTZ, baby ASA, carvedilol   Social History:Reviewed   Social History  Preferred Language: English ([place]) Race:  White Ethnicity: Not Hispanic / Latino Age: 68 Years 1 Months Marital Status:  M Alcohol:  No Recreational drug(s):  No   Smoking Status: Never smoker reviewed on 01/02/2012  Family History:Reviewed   Family History  No family h/o colon cancer    Objective Information: General:Well appearing, well nourished in no distress. Head:Atraumatic; no masses; no abnormalities Heart:RRR, no murmur or gallop.  Normal S1, S2.  No S3, S4.  Lungs:CTA bilaterally, no wheezes, rhonchi, rales.  Breathing unlabored. Abdomen:Soft, NT/ND, no HSM, no masses. deferred to procedure  Assessment:Need for screening TCS  Diagnosis &amp; Procedure: DiagnosisCode: V76.51, ProcedureCode: [address],    Plan:Scheduled for TCS on 01/16/12.   Patient Education:Alternative treatments to surgery were discussed with patient (and family).Risks and benefits  of procedure were fully  explained to the patient (and family) who gave informed consent. Patient/family questions were addressed.  Follow-up:Pending Surgery

## 2012-01-15 MED ORDER — SODIUM CHLORIDE 0.45 % IV SOLN
Freq: Once | INTRAVENOUS | Status: AC
  Administered 2012-01-16: 20 mL/h via INTRAVENOUS

## 2012-01-16 ENCOUNTER — Encounter (HOSPITAL_COMMUNITY): Payer: Self-pay | Admitting: *Deleted

## 2012-01-16 ENCOUNTER — Encounter (HOSPITAL_COMMUNITY)
Admission: RE | Disposition: A | Discharge status: Home / Self Care | Payer: Self-pay | Source: Ambulatory Visit | Attending: General Surgery

## 2012-01-16 ENCOUNTER — Ambulatory Visit (HOSPITAL_COMMUNITY)
Admission: RE | Admit: 2012-01-16 | Discharge: 2012-01-16 | Disposition: A | Discharge status: Home / Self Care | Payer: BC Managed Care – PPO | Source: Ambulatory Visit | Attending: General Surgery | Admitting: General Surgery

## 2012-01-16 DIAGNOSIS — Z1211 Encounter for screening for malignant neoplasm of colon: Secondary | ICD-10-CM | POA: Insufficient documentation

## 2012-01-16 DIAGNOSIS — Z79899 Other long term (current) drug therapy: Secondary | ICD-10-CM | POA: Insufficient documentation

## 2012-01-16 DIAGNOSIS — I10 Essential (primary) hypertension: Secondary | ICD-10-CM | POA: Insufficient documentation

## 2012-01-16 DIAGNOSIS — Z7982 Long term (current) use of aspirin: Secondary | ICD-10-CM | POA: Insufficient documentation

## 2012-01-16 HISTORY — DX: Essential (primary) hypertension: I10

## 2012-01-16 HISTORY — DX: Other seasonal allergic rhinitis: J30.2

## 2012-01-16 HISTORY — PX: COLONOSCOPY: SHX5424

## 2012-01-16 HISTORY — DX: Muscle spasm of back: M62.830

## 2012-01-16 HISTORY — DX: Gastro-esophageal reflux disease without esophagitis: K21.9

## 2012-01-16 HISTORY — DX: Atherosclerotic heart disease of native coronary artery without angina pectoris: I25.10

## 2012-01-16 HISTORY — DX: Cerebral infarction, unspecified: I63.9

## 2012-01-16 HISTORY — DX: Headache: R51

## 2012-01-16 SURGERY — COLONOSCOPY
Anesthesia: Moderate Sedation

## 2012-01-16 MED ORDER — ATROPINE SULFATE 1 MG/ML IJ SOLN
INTRAMUSCULAR | Status: DC | PRN
  Administered 2012-01-16: .5 mg via INTRAVENOUS

## 2012-01-16 MED ORDER — ATROPINE SULFATE 1 MG/ML IJ SOLN
INTRAMUSCULAR | Status: DC
Start: 2012-01-16 — End: 2012-01-16
  Filled 2012-01-16: qty 1

## 2012-01-16 MED ORDER — MIDAZOLAM HCL 5 MG/5ML IJ SOLN
INTRAMUSCULAR | Status: DC
Start: 2012-01-16 — End: 2012-01-16
  Filled 2012-01-16: qty 5

## 2012-01-16 MED ORDER — MEPERIDINE HCL 50 MG/ML IJ SOLN
INTRAMUSCULAR | Status: AC
  Filled 2012-01-16: qty 1

## 2012-01-16 MED ORDER — MEPERIDINE HCL 25 MG/ML IJ SOLN
INTRAMUSCULAR | Status: DC | PRN
  Administered 2012-01-16: 50 mg via INTRAVENOUS

## 2012-01-16 MED ORDER — STERILE WATER FOR IRRIGATION IR SOLN
Status: DC | PRN
  Administered 2012-01-16: 09:00:00

## 2012-01-16 MED ORDER — MIDAZOLAM HCL 5 MG/5ML IJ SOLN
INTRAMUSCULAR | Status: DC | PRN
  Administered 2012-01-16: 2 mg via INTRAVENOUS

## 2012-01-16 NOTE — Op Note (Signed)
[facility] [address] [place], [place]  [address]  COLONOSCOPY PROCEDURE REPORT  PATIENT:  Lisa Griffith, Lisa Griffith  MR#:  [id number] BIRTHDATE:  26-Nov-1946, 65 yrs. old  GENDER:  female ENDOSCOPIST:  Franky Macho, MD REF. BY:  Karleen Hampshire, M.D. PROCEDURE DATE:  01/16/2012 PROCEDURE:  Average-risk screening colonoscopy G0121 ASA CLASS:  Class II INDICATIONS:  Screening MEDICATIONS:   Versed 2 mg IV, demerol 50 mg IV, Atropine 0.5 mg IV  DESCRIPTION OF PROCEDURE:   After the risks benefits and alternatives of the procedure were thoroughly explained, informed consent was obtained.  Digital rectal exam was performed and revealed no abnormalities.   The [id number] ([id number]) endoscope was introduced through the anus and advanced to the cecum, which was identified by both the appendix and ileocecal valve, without limitations.  The quality of the prep was adequate..  The instrument was then slowly withdrawn as the colon was fully examined.  FINDINGS:  A normal appearing cecum, ileocecal valve, and appendiceal orifice were identified. The ascending, hepatic flexure, transverse, splenic flexure, descending, sigmoid colon, and rectum appeared unremarkable.   Retroflexed views in the rectum revealed no abnormalities.  The scope was then withdrawn from the cecum and the procedure completed. COMPLICATIONS:  Atropine given for bradycardiato 39, though patient did not become hypotensive. ENDOSCOPIC IMPRESSION: 1) Normal colon RECOMMENDATIONS:  REPEAT EXAM:  In 10 year(s) for Colonoscopy.  ______________________________ Franky Macho, MD  CC:  Karleen Hampshire, MD  n. Rosalie DoctorFranky Macho at 01/16/2012 09:26 AM  Willeen Cass, [phone]

## 2012-01-16 NOTE — Interval H&P Note (Signed)
History and Physical Interval Note:  01/16/2012 9:03 AM  Lisa Griffith  has presented today for surgery, with the diagnosis of Special screening for malignant neoplasms, colon  The various methods of treatment have been discussed with the patient and family. After consideration of risks, benefits and other options for treatment, the patient has consented to  Procedure(s) (LRB): COLONOSCOPY (N/A) as a surgical intervention .  The patients' history has been reviewed, patient examined, no change in status, stable for surgery.  I have reviewed the patients' chart and labs.  Questions were answered to the patient's satisfaction.     Franky Macho A

## 2012-01-16 NOTE — Discharge Instructions (Signed)
Colonoscopy  Care After  Read the instructions outlined below and refer to this sheet in the next few weeks. These discharge instructions provide you with general information on caring for yourself after you leave the hospital. Your doctor may also give you specific instructions. While your treatment has been planned according to the most current medical practices available, unavoidable complications occasionally occur. If you have any problems or questions after discharge, call your doctor.  HOME CARE INSTRUCTIONS  ACTIVITY:  · You may resume your regular activity, but move at a slower pace for the next 24 hours.   · Take frequent rest periods for the next 24 hours.   · Walking will help get rid of the air and reduce the bloated feeling in your belly (abdomen).   · No driving for 24 hours (because of the medicine (anesthesia) used during the test).   · You may shower.   · Do not sign any important legal documents or operate any machinery for 24 hours (because of the anesthesia used during the test).   NUTRITION:  · Drink plenty of fluids.   · You may resume your normal diet as instructed by your doctor.   · Begin with a light meal and progress to your normal diet. Heavy or fried foods are harder to digest and may make you feel sick to your stomach (nauseated).   · Avoid alcoholic beverages for 24 hours or as instructed.   MEDICATIONS:  · You may resume your normal medications unless your doctor tells you otherwise.   WHAT TO EXPECT TODAY:  · Some feelings of bloating in the abdomen.   · Passage of more gas than usual.   · Spotting of blood in your stool or on the toilet paper.   IF YOU HAD POLYPS REMOVED DURING THE COLONOSCOPY:  · No aspirin products for 7 days or as instructed.   · No alcohol for 7 days or as instructed.   · Eat a soft diet for the next 24 hours.   FINDING OUT THE RESULTS OF YOUR TEST  Not all test results are available during your visit. If your test results are not back during the visit, make an  appointment with your caregiver to find out the results. Do not assume everything is normal if you have not heard from your caregiver or the medical facility. It is important for you to follow up on all of your test results.   SEEK IMMEDIATE MEDICAL CARE IF:  · You have more than a spotting of blood in your stool.   · Your belly is swollen (abdominal distention).   · You are nauseated or vomiting.   · You have a fever.   · You have abdominal pain or discomfort that is severe or gets worse throughout the day.   Document Released: 06/27/2004 Document Revised: 07/26/2011 Document Reviewed: 06/25/2008  ExitCare® Patient Information ©2012 [employer].

## 2012-01-16 NOTE — OR Nursing (Signed)
915 pt's heart rate dropped to 39 bpm; per verbal order Dr Lovell Sheehan administered 0.5mg ; pt placed on face mask oxygen at 12 lpm.  Heart rate quickly to 90 bpm; oxygen sat at 98.

## 2012-01-23 ENCOUNTER — Encounter (HOSPITAL_COMMUNITY): Payer: Self-pay | Admitting: General Surgery

## 2012-07-22 ENCOUNTER — Other Ambulatory Visit: Payer: Self-pay | Admitting: Certified Nurse Midwife

## 2012-07-22 DIAGNOSIS — Z139 Encounter for screening, unspecified: Secondary | ICD-10-CM

## 2012-07-22 DIAGNOSIS — N6320 Unspecified lump in the left breast, unspecified quadrant: Secondary | ICD-10-CM

## 2012-08-27 ENCOUNTER — Ambulatory Visit (HOSPITAL_COMMUNITY): Payer: BC Managed Care – PPO

## 2012-08-28 ENCOUNTER — Ambulatory Visit (HOSPITAL_COMMUNITY)
Admission: RE | Admit: 2012-08-28 | Discharge: 2012-08-28 | Disposition: A | Discharge status: Home / Self Care | Payer: BC Managed Care – PPO | Source: Ambulatory Visit | Attending: Certified Nurse Midwife | Admitting: Certified Nurse Midwife

## 2012-08-28 ENCOUNTER — Other Ambulatory Visit: Payer: Self-pay | Admitting: Certified Nurse Midwife

## 2012-08-28 ENCOUNTER — Other Ambulatory Visit (HOSPITAL_COMMUNITY): Payer: Self-pay | Admitting: Certified Nurse Midwife

## 2012-08-28 DIAGNOSIS — N6320 Unspecified lump in the left breast, unspecified quadrant: Secondary | ICD-10-CM

## 2012-08-28 DIAGNOSIS — N63 Unspecified lump in unspecified breast: Secondary | ICD-10-CM | POA: Insufficient documentation

## 2012-12-09 ENCOUNTER — Ambulatory Visit (HOSPITAL_COMMUNITY)
Admission: RE | Admit: 2012-12-09 | Discharge: 2012-12-09 | Disposition: A | Discharge status: Home / Self Care | Payer: BC Managed Care – PPO | Source: Ambulatory Visit | Attending: Internal Medicine | Admitting: Internal Medicine

## 2012-12-09 ENCOUNTER — Other Ambulatory Visit (HOSPITAL_COMMUNITY): Payer: Self-pay | Admitting: Internal Medicine

## 2012-12-09 DIAGNOSIS — M545 Low back pain, unspecified: Secondary | ICD-10-CM | POA: Insufficient documentation

## 2012-12-09 DIAGNOSIS — M79609 Pain in unspecified limb: Secondary | ICD-10-CM | POA: Insufficient documentation

## 2012-12-09 DIAGNOSIS — M5430 Sciatica, unspecified side: Secondary | ICD-10-CM

## 2013-04-03 ENCOUNTER — Other Ambulatory Visit (HOSPITAL_COMMUNITY): Payer: Self-pay | Admitting: Family Medicine

## 2013-04-03 DIAGNOSIS — Z139 Encounter for screening, unspecified: Secondary | ICD-10-CM

## 2013-04-07 ENCOUNTER — Ambulatory Visit (HOSPITAL_COMMUNITY)
Admission: RE | Admit: 2013-04-07 | Discharge: 2013-04-07 | Disposition: A | Discharge status: Home / Self Care | Payer: BC Managed Care – PPO | Source: Ambulatory Visit | Attending: Family Medicine | Admitting: Family Medicine

## 2013-04-07 DIAGNOSIS — Z139 Encounter for screening, unspecified: Secondary | ICD-10-CM

## 2013-04-07 DIAGNOSIS — Z780 Asymptomatic menopausal state: Secondary | ICD-10-CM | POA: Insufficient documentation

## 2013-04-07 DIAGNOSIS — M1990 Unspecified osteoarthritis, unspecified site: Secondary | ICD-10-CM | POA: Insufficient documentation

## 2013-07-12 ENCOUNTER — Encounter (HOSPITAL_COMMUNITY): Payer: Self-pay | Admitting: *Deleted

## 2013-07-12 ENCOUNTER — Emergency Department (HOSPITAL_COMMUNITY)
Admission: EM | Admit: 2013-07-12 | Discharge: 2013-07-12 | Disposition: A | Discharge status: Home / Self Care | Payer: BC Managed Care – PPO | Attending: Emergency Medicine | Admitting: Emergency Medicine

## 2013-07-12 DIAGNOSIS — K219 Gastro-esophageal reflux disease without esophagitis: Secondary | ICD-10-CM | POA: Insufficient documentation

## 2013-07-12 DIAGNOSIS — M5430 Sciatica, unspecified side: Secondary | ICD-10-CM | POA: Insufficient documentation

## 2013-07-12 DIAGNOSIS — Z9861 Coronary angioplasty status: Secondary | ICD-10-CM | POA: Insufficient documentation

## 2013-07-12 DIAGNOSIS — Z87828 Personal history of other (healed) physical injury and trauma: Secondary | ICD-10-CM | POA: Insufficient documentation

## 2013-07-12 DIAGNOSIS — M5431 Sciatica, right side: Secondary | ICD-10-CM

## 2013-07-12 DIAGNOSIS — Z87891 Personal history of nicotine dependence: Secondary | ICD-10-CM | POA: Insufficient documentation

## 2013-07-12 DIAGNOSIS — IMO0002 Reserved for concepts with insufficient information to code with codable children: Secondary | ICD-10-CM | POA: Insufficient documentation

## 2013-07-12 DIAGNOSIS — M549 Dorsalgia, unspecified: Secondary | ICD-10-CM | POA: Insufficient documentation

## 2013-07-12 DIAGNOSIS — I2510 Atherosclerotic heart disease of native coronary artery without angina pectoris: Secondary | ICD-10-CM | POA: Insufficient documentation

## 2013-07-12 DIAGNOSIS — Z7982 Long term (current) use of aspirin: Secondary | ICD-10-CM | POA: Insufficient documentation

## 2013-07-12 DIAGNOSIS — Z8673 Personal history of transient ischemic attack (TIA), and cerebral infarction without residual deficits: Secondary | ICD-10-CM | POA: Insufficient documentation

## 2013-07-12 DIAGNOSIS — I10 Essential (primary) hypertension: Secondary | ICD-10-CM | POA: Insufficient documentation

## 2013-07-12 HISTORY — DX: Other intervertebral disc displacement, lumbar region: M51.26

## 2013-07-12 MED ORDER — KETOROLAC TROMETHAMINE 60 MG/2ML IM SOLN
60.0000 mg | Freq: Once | INTRAMUSCULAR | Status: AC
  Administered 2013-07-12: 60 mg via INTRAMUSCULAR
  Filled 2013-07-12: qty 2

## 2013-07-12 MED ORDER — PREDNISONE 10 MG PO TABS: ORAL_TABLET | ORAL | Status: DC

## 2013-07-12 MED ORDER — TRAMADOL HCL 50 MG PO TABS: 50.0000 mg | ORAL_TABLET | Freq: Four times a day (QID) | ORAL | Status: DC | PRN

## 2013-07-12 NOTE — ED Notes (Signed)
Patient with no complaints at this time. Respirations even and unlabored. Skin warm/dry. Discharge instructions reviewed with patient at this time. Patient given opportunity to voice concerns/ask questions. Patient discharged at this time and left Emergency Department with steady gait.   

## 2013-07-12 NOTE — ED Notes (Signed)
Chronic R lumbar spine and hip pain episode began Monday after using treadmill x 3 weeks.  Pain has been intermittent for 30 years., but has recently become worse.  Associated w/numbness and tingling in R foot. Pain is stabbing at times, stopping movement. Currently  10/10.

## 2013-07-16 NOTE — ED Provider Notes (Signed)
CSN: [id number]     Arrival date & time 07/12/13  0825 History     First MD Initiated Contact with Patient 07/12/13 [phone]     Chief Complaint  Patient presents with  . Hip Pain   (Consider location/radiation/quality/duration/timing/severity/associated sxs/prior Treatment) Patient is a 67 y.o. female presenting with hip pain. The history is provided by the patient.  Hip Pain This is a recurrent problem. The current episode started 1 to 4 weeks ago. The problem occurs constantly. The problem has been gradually worsening. Associated symptoms include arthralgias. Pertinent negatives include no abdominal pain, change in bowel habit, chest pain, chills, coughing, diaphoresis, fever, headaches, joint swelling, myalgias, nausea, neck pain, numbness, rash, urinary symptoms, vomiting or weakness. The symptoms are aggravated by standing, walking, twisting and bending. She has tried NSAIDs for the symptoms. The treatment provided mild relief.    Past Medical History  Diagnosis Date  . Hypertension   . Stroke     "mild"   . Coronary artery disease   . GERD (gastroesophageal reflux disease)   . Back muscle spasm   . Headache(784.0)   . Seasonal allergies   . Ruptured lumbar disc    Past Surgical History  Procedure Laterality Date  . Cataract extraction, bilateral  2008    [facility]  . Cardiac catheterization  12/2008    [facility]  . Posterior laminectomy / decompression cervical spine      [facility]  . Colonoscopy  01/16/2012    Procedure: COLONOSCOPY;  Surgeon: Dalia Heading, MD;  Location: [facility];  Service: Gastroenterology;  Laterality: N/A;   History reviewed. No pertinent family history. History  Substance Use Topics  . Smoking status: Former Smoker    Types: Cigarettes  . Smokeless tobacco: Not on file  . Alcohol Use: Yes     Comment: occasional wine/beer   OB History   Grav Para Term Preterm Abortions TAB SAB Ect Mult Living                 Review of Systems    Constitutional: Negative for fever, chills and diaphoresis.  HENT: Negative for neck pain.   Respiratory: Negative for cough and chest tightness.   Cardiovascular: Negative for chest pain and leg swelling.  Gastrointestinal: Negative for nausea, vomiting, abdominal pain and change in bowel habit.  Genitourinary: Negative for dysuria, urgency, flank pain, difficulty urinating, vaginal pain and pelvic pain.  Musculoskeletal: Positive for back pain and arthralgias. Negative for myalgias and joint swelling.  Skin: Negative for color change, rash and wound.  Neurological: Negative for weakness, numbness and headaches.  All other systems reviewed and are negative.    Allergies  Statins  Home Medications   Current Outpatient Rx  Name  Route  Sig  Dispense  Refill  . aspirin 81 MG tablet   Oral   Take 81 mg by mouth daily.         . calcium carbonate (OS-CAL) 600 MG TABS tablet   Oral   Take 600 mg by mouth daily with breakfast.         . carvedilol (COREG) 25 MG tablet   Oral   Take 25 mg by mouth 2 (two) times daily with a meal.         . cholecalciferol (VITAMIN D) 1000 UNITS tablet   Oral   Take 1 Units by mouth daily.         Marland Kitchen lisinopril-hydrochlorothiazide (PRINZIDE,ZESTORETIC) 10-12.5 MG per tablet   Oral  Take 1 tablet by mouth 2 (two) times daily.         . naproxen sodium (ANAPROX) 220 MG tablet   Oral   Take 220 mg by mouth daily as needed (for pain).         . predniSONE (DELTASONE) 10 MG tablet      Take 6 tablets day one, 5 tablets day two, 4 tablets day three, 3 tablets day four, 2 tablets day five, then 1 tablet day six   21 tablet   0   . traMADol (ULTRAM) 50 MG tablet   Oral   Take 1 tablet (50 mg total) by mouth every 6 (six) hours as needed for pain.   20 tablet   0    BP 139/55  Pulse 79  Temp(Src) 98 F (36.7 C) (Oral)  Resp 17  SpO2 94% Physical Exam  Nursing note and vitals reviewed. Constitutional: She is oriented to  person, place, and time. She appears well-developed and well-nourished. No distress.  HENT:  Head: Normocephalic and atraumatic.  Neck: Normal range of motion. Neck supple.  Cardiovascular: Normal rate, regular rhythm, normal heart sounds and intact distal pulses.   No murmur heard. Pulmonary/Chest: Effort normal and breath sounds normal. No respiratory distress.  Abdominal: Soft. She exhibits no distension. There is no tenderness.  Musculoskeletal: She exhibits tenderness. She exhibits no edema.       Lumbar back: She exhibits tenderness and pain. She exhibits normal range of motion, no swelling, no deformity, no laceration and normal pulse.       Back:  ttp of the right lumbar paraspinal muscles and SI joint.  No spinal tenderness.  DP pulses are brisk and symmetrical.  Distal sensation intact.  Hip Flexors/Extensors are intact.  Pain to right hip reproduced with SLR on right.  No bony deformity, right leg is not shortened or externally rotated.    Neurological: She is alert and oriented to person, place, and time. No cranial nerve deficit or sensory deficit. She exhibits normal muscle tone. Coordination and gait normal.  Reflex Scores:      Patellar reflexes are 2+ on the right side and 2+ on the left side.      Achilles reflexes are 2+ on the right side and 2+ on the left side. Skin: Skin is warm and dry.    ED Course   Procedures (including critical care time)  Labs Reviewed - No data to display No results found. 1. Sciatica neuralgia, right     MDM     Patient has ttp of the lumbar paraspinal muscles and right SI joint space.  No focal neuro deficits on exam.  Ambulates with a steady gait.   No concerning sx's for emergent neurological or infectious process.  Likely acute on chronic pain.  Pt agrees to f/u with her PMD.  VSS.  Appears stable for discharge.  Azekiel Cremer L. Trisha Mangle, PA-C 07/16/13 1352

## 2013-07-17 NOTE — ED Provider Notes (Signed)
Medical screening examination/treatment/procedure(s) were performed by non-physician practitioner and as supervising physician I was immediately available for consultation/collaboration.   Foye Damron W. Chade Pitner, MD 07/17/13 0746 

## 2013-07-21 ENCOUNTER — Other Ambulatory Visit (HOSPITAL_COMMUNITY): Payer: Self-pay | Admitting: Orthopedic Surgery

## 2013-07-21 DIAGNOSIS — M545 Low back pain: Secondary | ICD-10-CM

## 2013-07-21 DIAGNOSIS — M47816 Spondylosis without myelopathy or radiculopathy, lumbar region: Secondary | ICD-10-CM

## 2013-07-23 ENCOUNTER — Ambulatory Visit (HOSPITAL_COMMUNITY)
Admission: RE | Admit: 2013-07-23 | Discharge: 2013-07-23 | Disposition: A | Discharge status: Home / Self Care | Payer: BC Managed Care – PPO | Source: Ambulatory Visit | Attending: Orthopedic Surgery | Admitting: Orthopedic Surgery

## 2013-07-23 DIAGNOSIS — M5137 Other intervertebral disc degeneration, lumbosacral region: Secondary | ICD-10-CM | POA: Insufficient documentation

## 2013-07-23 DIAGNOSIS — M545 Low back pain, unspecified: Secondary | ICD-10-CM | POA: Insufficient documentation

## 2013-07-23 DIAGNOSIS — M47816 Spondylosis without myelopathy or radiculopathy, lumbar region: Secondary | ICD-10-CM

## 2013-07-23 DIAGNOSIS — M51379 Other intervertebral disc degeneration, lumbosacral region without mention of lumbar back pain or lower extremity pain: Secondary | ICD-10-CM | POA: Insufficient documentation

## 2013-07-23 DIAGNOSIS — M47817 Spondylosis without myelopathy or radiculopathy, lumbosacral region: Secondary | ICD-10-CM | POA: Insufficient documentation

## 2013-09-02 ENCOUNTER — Ambulatory Visit (HOSPITAL_COMMUNITY)
Admission: RE | Admit: 2013-09-02 | Discharge: 2013-09-02 | Disposition: A | Discharge status: Home / Self Care | Payer: BC Managed Care – PPO | Source: Ambulatory Visit | Attending: Orthopedic Surgery | Admitting: Orthopedic Surgery

## 2013-09-02 DIAGNOSIS — IMO0001 Reserved for inherently not codable concepts without codable children: Secondary | ICD-10-CM | POA: Insufficient documentation

## 2013-09-02 DIAGNOSIS — M533 Sacrococcygeal disorders, not elsewhere classified: Secondary | ICD-10-CM | POA: Insufficient documentation

## 2013-09-02 DIAGNOSIS — M25559 Pain in unspecified hip: Secondary | ICD-10-CM | POA: Insufficient documentation

## 2013-09-02 DIAGNOSIS — M545 Low back pain, unspecified: Secondary | ICD-10-CM | POA: Insufficient documentation

## 2013-09-02 NOTE — Evaluation (Signed)
Physical Therapy Evaluation  Patient Details  Name: Lisa Griffith MRN: [id number] Date of Birth: [birth year]  Today's Date: 09/02/2013 Time: 1525-1600 PT Time Calculation (min): 35 min Charges: 1 evaluation Manual x8 minutes              Visit#: 1 of 8  Re-eval: 10/02/13 Assessment Diagnosis: Low back/Lt hip pain Next MD Visit: Dr. Chong Sicilian - 09/09/13  Authorization: secondary Medicare    Authorization Time Period:    Authorization Visit#: 1 of 10   Past Medical History:  Past Medical History  Diagnosis Date  . Hypertension   . Stroke     "mild"   . Coronary artery disease   . GERD (gastroesophageal reflux disease)   . Back muscle spasm   . Headache(784.0)   . Seasonal allergies   . Ruptured lumbar disc    Past Surgical History:  Past Surgical History  Procedure Laterality Date  . Cataract extraction, bilateral  2008    [facility]  . Cardiac catheterization  12/2008    [facility]  . Posterior laminectomy / decompression cervical spine      [facility]  . Colonoscopy  01/16/2012    Procedure: COLONOSCOPY;  Surgeon: Dalia Heading, MD;  Location: [facility];  Service: Gastroenterology;  Laterality: N/A;    Subjective Symptoms/Limitations Symptoms: Pt is a 67 year old female referred to PT for low back pain/Rt hip pain.  She reports that she has recieved a shot on Tuesday September 23rd and then she was reaching for her coffee on a Sunday and hurt her low back.  She reports that a month before her back started to hurt, she was attending family fitness and going every day.  She was using the TM and bike.  Reports that she has always had weakness on her Rt leg from 30- 40 years ago and injured .  Denies bowel and bladder dysfunction, + Neuropathic pain to RLE and + balance difficulty and feels that she may fall due to the pain and heaviness in her leg.  Limitations: Sitting;Standing;Walking How long can you sit comfortably?: 30 minutes at the most How long can you stand  comfortably?: more comfortable, about 30 minutes  How long can you walk comfortably?: feels better when moving, except when placing full weight through her RLE Patient Stated Goals: get well Pain Assessment Currently in Pain?: Yes Pain Score: 6  Pain Location: Hip (Neuropathic pain) Pain Orientation: Right Pain Type: Acute pain Pain Frequency: Constant Pain Relieving Factors: muscle relaxors, pain medication Effect of Pain on Daily Activities: unable to go to the gym for exercise  Precautions/Restrictions  Precautions Precautions: None  Balance Screening Balance Screen Has the patient fallen in the past 6 months: No Has the patient had a decrease in activity level because of a fear of falling? : Yes Is the patient reluctant to leave their home because of a fear of falling? 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    Cognition/Observation Observation/Other Assessments Observations: RLE goes short to long Other Assessments: Rt anterior rotation with inflare  Sensation/Coordination/Flexibility/Functional Tests Coordination Gross Motor Movements are Fluid and Coordinated: No Coordination and Movement Description: impaired to transverse abdominous, multifidus and pelvic floor.  Functional Tests Functional Tests: FOTO: Status: 55%, Limitations: 45%  Assessment RLE Strength Right Hip Flexion: 4/5 Right Hip Extension: 3/5 Right Hip  ABduction: 3+/5 Right Hip ADduction: 4/5 LLE Strength Left Hip Flexion: 4/5 Left Hip Extension: 3/5 Left Hip ABduction: 4/5 Left Hip ADduction: 4/5 Lumbar AROM Lumbar Flexion: decreased 40% - increased pain Lumbar Extension: WNL - decreased pain Lumbar - Right Side Bend: decreased 50% - significant pain Lumbar - Left Side Bend: decreased 25% - mild  pain Lumbar - Right Rotation: decreased 15% in creased pain Lumbar - Left Rotation: decreased 10% pain Palpation Palpation: pain and tenderness over Rt ASIS and gluteal region  Mobility/Balance  Ambulation/Gait Ambulation/Gait: Yes Assistive device: None Gait Pattern: Antalgic;Lateral hip instability;Decreased weight shift to right;Decreased trunk rotation   Exercise/Treatments Manual Therapy Manual Therapy: Other (comment) Other Manual Therapy: Muscle Energy Technique (MET) to correct Rt sacroiliac (SI) inflare w/100% correction  Physical Therapy Assessment and Plan PT Assessment and Plan Clinical Impression Statement: Pt is a 67 year old female referred to PT for low back pain w/reports of Lt hip pain with impairments listed below.  After evaluation it was found that she has signficant SI dysfunction which corrects 100% with manual techniques and reports 0/10 pain after manual therapy. Provided exercises to complete at home and will continue with during f/u treatment.  Pt will benefit from skilled therapeutic intervention in order to improve on the following deficits: Pain;Improper body mechanics;Improper spinal/pelvic alignment;Impaired perceived functional ability;Decreased strength;Decreased range of motion Rehab Potential: Good PT Frequency: Min 2X/week PT Duration: 4 weeks PT Treatment/Interventions: Functional mobility training;Therapeutic activities;Therapeutic exercise;Balance training;Neuromuscular re-education;Patient/family education;Manual techniques;Modalities PT Plan: MET for SI correction as needed.  Educate on HEP: ab sets, bent knee raise, bridges, SLR, hip abduction, multifidus, opp arm/leg, squats, side lunges, heel and toe raises). Educate on posture.  Progress to therabanad and continue to progress hip and core strengthening. Pt wishes to return to [employer].     Goals Home Exercise Program Pt/caregiver will Perform Home Exercise Program: Independently PT  Goal: Perform Home Exercise Program - Progress: Goal set today PT Short Term Goals Time to Complete Short Term Goals: 2 weeks PT Short Term Goal 1: Pt will report pain to her Lt hip region less than a 2/10 to improve QOL.  PT Short Term Goal 2: Pt will be educated on and verabalize proper posture.  PT Short Term Goal 3: Pt will improve BLE strength by 1 muscle grade to maintain SI alignment.  PT Short Term Goal 4: Pt will be educated on how to self correct SI alignment.  PT Long Term Goals Time to Complete Long Term Goals: 4 weeks PT Long Term Goal 1: Pt will present with normalized SI alignment in order to report 0/10 pain to her Lt hip when sitting and walking. PT Long Term Goal 2: Pt will improve her postural strength to WNL in order to ambulate with appropriate gait mechanics to decrease risk of secondary injury.  Long Term Goal 3: Pt will improve her proprioceptive awareness and independently demonstrate proper sitting and standing posture in order to safely return to her exercise routine for weight loss.  Long Term Goal 4: Pt will improve her FOTO to status greater than 63% and Limitation less than 37% for improved perceived functional ability.   Problem List Patient Active Problem List   Diagnosis Date Noted  . 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 Low back pain 09/02/2013    PT - End of Session Activity Tolerance: Patient tolerated treatment well PT Plan of Care PT Home Exercise Plan: given PT Patient Instructions: importance of HEP and posture, MET to correct SI  alignment Consulted and Agree with Plan of Care: Patient  GP Functional Assessment Tool Used: FOTO: Status: 55%, Limitations: 45% Functional Limitation: Other PT primary Other PT Primary Current Status (J1914): At least 40 percent but less than 60 percent impaired, limited or restricted Other PT Primary Goal Status (N8295): At least 20 percent but less than 40 percent impaired, limited or restricted  Braylen Staller,  MPT, ATC 09/02/2013, 4:24 PM  Physician Documentation Your signature is required to indicate approval of the treatment plan as stated above.  Please sign and either send electronically or make a copy of this report for your files and return this physician signed original.   Please mark one 1.__approve of plan  2. ___approve of plan with the following conditions.   ______________________________                                                          _____________________ Physician Signature                                                                                                             Date

## 2013-09-08 ENCOUNTER — Ambulatory Visit (HOSPITAL_COMMUNITY)
Admission: RE | Admit: 2013-09-08 | Discharge: 2013-09-08 | Disposition: A | Discharge status: Home / Self Care | Payer: BC Managed Care – PPO | Source: Ambulatory Visit | Attending: Orthopedic Surgery | Admitting: Orthopedic Surgery

## 2013-09-08 NOTE — Progress Notes (Signed)
Physical Therapy Treatment Patient Details  Name: Lisa Griffith MRN: [id number] Date of Birth: [birth year]  Today's Date: 09/08/2013 Time: 1640-1720 PT Time Calculation (min): 40 min  Visit#: 2 of 8  Re-eval: 10/02/13 Authorization: secondary Medicare  Authorization Visit#: 2 of 10 Charges:  therex 38'   Subjective: Symptoms/Limitations Symptoms: Pt state she is feeling some better with only mild pain.  Pt states she did not understand her HEP.   Exercise/Treatments Stretches Piriformis Stretch: 3 reps;30 seconds;Limitations Piriformis Stretch Limitations: bilateral in supine Supine Ab Set: 10 reps;5 seconds Clam: 10 reps Bent Knee Raise: 10 reps Bridge: 10 reps Straight Leg Raise: 10 reps    Physical Therapy Assessment and Plan PT Assessment and Plan Clinical Impression Statement: Reviewed HEP with pt requiring multimodal cues to perform in correct form and stabilzation.  Pt admits to being non-compliant to HEP as she did not understand some of the exercises and wanted to make sure she was doing them correctly.  Added piriformis stretch in supine with noted tightness bilaterally.    SI was in good alignment today without need for MET Pt will benefit from skilled therapeutic intervention in order to improve on the following deficits: Pain;Improper body mechanics;Improper spinal/pelvic alignment;Impaired perceived functional ability;Decreased strength;Decreased range of motion Rehab Potential: Good PT Frequency: Min 2X/week PT Duration: 4 weeks PT Plan: MET for SI correction as needed. Continue to Educate on HEP: ab sets, bent knee raise, bridges, SLR, hip abduction, multifidus, opp arm/leg, squats, side lunges, heel and toe raises). Educate on posture.  Progress to theraband and continue to progress hip and core strengthening. Pt wishes to return to [employer].      Problem List Patient Active Problem List   Diagnosis Date Noted  . Sacroiliac dysfunction  09/02/2013  . Low back pain 09/02/2013    PT - End of Session Activity Tolerance: Patient tolerated treatment well PT Plan of Care PT Patient Instructions: importance of HEP and posture, MET to correct SI alignment Consulted and Agree with Plan of Care: Patient   Lurena Nida, PTA/CLT 09/08/2013, 5:36 PM

## 2013-09-09 ENCOUNTER — Ambulatory Visit (HOSPITAL_COMMUNITY): Payer: BC Managed Care – PPO

## 2013-09-10 ENCOUNTER — Ambulatory Visit (HOSPITAL_COMMUNITY)
Admission: RE | Admit: 2013-09-10 | Discharge: 2013-09-10 | Disposition: A | Discharge status: Home / Self Care | Payer: BC Managed Care – PPO | Source: Ambulatory Visit | Attending: Family Medicine | Admitting: Family Medicine

## 2013-09-10 DIAGNOSIS — M533 Sacrococcygeal disorders, not elsewhere classified: Secondary | ICD-10-CM

## 2013-09-10 DIAGNOSIS — M545 Low back pain: Secondary | ICD-10-CM

## 2013-09-10 NOTE — Progress Notes (Addendum)
Physical Therapy Treatment Patient Details  Name: Lisa Griffith MRN: [id number] Date of Birth: 06/15/1946  Today's Date: 09/10/2013 Time: 1350-1430 PT Time Calculation (min): 40 min Charges: Manual: 1350-1405 TE: 1405-1430 Visit#: 3 of 8  Re-eval: 10/02/13   Authorization: secondary Medicare  Authorization Time Period:    Authorization Visit#: 3 of 10   Subjective: Symptoms/Limitations Symptoms: states that she went to Dr. Faythe Ghee today who is going to schedule her a shot for tomorrow. Pain Assessment Currently in Pain?: Yes Pain Score: 4  Pain Location: Hip Pain Orientation: Right Pain Type: Acute pain Pain Frequency: Constant  Exercise/Treatments Stretches Piriformis Stretch: 3 reps;30 seconds;Limitations Piriformis Stretch Limitations: bilateral in supine Aerobic Tread Mill: 5 minutes incline 5.0 at end of session Supine Ab Set: 10 reps;5 seconds;Limitations AB Set Limitations: PT faciliation Bridge: 20 reps Straight Leg Raise: 10 reps;Limitations Straight Leg Raises Limitations: BLE Sidelying Hip Abduction: 10 reps;Limitations Hip Abduction Limitations: RLE  Manual Therapy Manual Therapy: Myofascial release Myofascial Release: to Rt gluteal region to piriformis Other Manual Therapy: Muscle Energy Technique (MET) to correct Rt sacroiliac (SI) inflare w/100% correction w/Strain counter strain to Rt piriformis in supine w/75% release w/MFR afterwards  Physical Therapy Assessment and Plan PT Assessment and Plan Clinical Impression Statement: Pt corrected 100% to SI with 75% reduction in fascial restrictions after manual therapy.  Pt completed TE afterwards to improve core stability and decrease risk of continued SI dysfunction. Enocuraged pt to continue with hip strengthening  PT Plan: MET for SI correction as needed. Continue to Educate on HEP: ab sets, bent knee raise, bridges, SLR, hip abduction, multifidus, opp arm/leg, squats, side lunges, heel and toe  raises). Educate on posture.  Progress to theraband and continue to progress hip and core strengthening. Pt wishes to return to [employer].     Goals    Problem List Patient Active Problem List   Diagnosis Date Noted  . 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 Low back pain 09/02/2013    PT - End of Session Activity Tolerance: Patient tolerated treatment well PT Plan of Care PT Patient Instructions: importance of HEP and posture, MET to correct SI alignment Consulted and Agree with Plan of Care: Patient  GP    Lafreda Casebeer, MPT, ATC 09/10/2013, 5:36 PM

## 2013-09-11 ENCOUNTER — Ambulatory Visit (HOSPITAL_COMMUNITY): Payer: BC Managed Care – PPO

## 2013-09-15 ENCOUNTER — Ambulatory Visit (HOSPITAL_COMMUNITY)
Admission: RE | Admit: 2013-09-15 | Discharge: 2013-09-15 | Disposition: A | Discharge status: Home / Self Care | Payer: BC Managed Care – PPO | Source: Ambulatory Visit | Attending: Family Medicine | Admitting: Family Medicine

## 2013-09-15 DIAGNOSIS — M533 Sacrococcygeal disorders, not elsewhere classified: Secondary | ICD-10-CM

## 2013-09-15 DIAGNOSIS — M545 Low back pain: Secondary | ICD-10-CM

## 2013-09-15 NOTE — Progress Notes (Signed)
Physical Therapy Treatment Patient Details  Name: Lisa Griffith MRN: [id number] Date of Birth: Oct 21, 1946  Today's Date: 09/15/2013 Time: 1100-1144 PT Time Calculation (min): 44 min Charges: Manual: 1100-1105 TE: 4098-1191 Visit#: 4 of 8  Re-eval: 10/02/13    Authorization: secondary Medicare  Authorization Time Period:    Authorization Visit#: 4 of 10   Subjective: Symptoms/Limitations Symptoms: Pt reports that she feels she is getting stronger.  Still is having most pain in the morning to her right side.   Precautions/Restrictions     Exercise/Treatments Stretches Quadruped Mid Back Stretch: 1 rep;30 seconds Aerobic Tread Mill: 10 minutes (incline 3.0 1.0 mph x7 min, 0 incline x1. x3 min) after MET Standing Functional Squats: 10 reps;Limitations Functional Squats Limitations: PT facilaition for proper technique Side Lunge: 10 reps;Limitations Side Lunge Limitations: PT facilaition for technique Row: Both;15 reps;Theraband;Limitations Theraband Level (Row): Level 2 (Red) Row Limitations: PT facilitation Shoulder Extension: Both;15 reps;Theraband;Limitations Theraband Level (Shoulder Extension): Level 2 (Red) Shoulder Extension Limitations: PT facilaition Shoulder ADduction: Both;15 reps;Theraband;Limitations Theraband Level (Shoulder Adduction): Level 2 (Red) Shoulder Adduction Limitations: PT facilitation Supine Clam: 15 reps;Limitations Clam Limitations: w/ab set Bent Knee Raise: 15 reps;Limitations Bent Knee Raise Limitations: w/ab set Sidelying Hip Abduction: 10 reps;Limitations Hip Abduction Limitations: BLE - quick reps with min A for RLE Other Sidelying Lumbar Exercises: Hip circles BLE: forward and backwards with MinA for RLE x10 reps Quadruped Other Quadruped Lumbar Exercises: Hip ER: 2x5 reps BLE  Manual Therapy Manual Therapy: Other (comment) Other Manual Therapy: Muscle Energy Technique (MET) to correct Rt sacroiliac (SI) anteiror  rotation w/100% correction  Physical Therapy Assessment and Plan PT Assessment and Plan Clinical Impression Statement: Continued to educate pt on importance of core coordination and strength to improve SI stability and decreae pain.  Pt has improved gait mechanics and is pain free at end of session.  Pt plans to return slowly to her gym to continue with her exercises. Added exercises to improve gluteus medius, piriformis and quadratus lumborum core musculature to ensure improved SI stability.  PT Plan: [facility] note for MD apt on Friday; MET for SI correction as needed. Add hip hikes and stair training (as knee allows)    Goals    Problem List Patient Active Problem List   Diagnosis Date Noted  . 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 Low back pain 09/02/2013    PT - End of Session Activity Tolerance: Patient tolerated treatment well PT Plan of Care PT Home Exercise Plan: updated with Red theraband postural strengthenign and provided red t-band PT Patient Instructions: encouraged to return to the gym slowly, educated on importance of proper core coodination and strength.  Consulted and Agree with Plan of Care: Patient  GP    Kylo Gavin, MPT, ATC 09/15/2013, 12:01 PM

## 2013-09-16 ENCOUNTER — Ambulatory Visit (HOSPITAL_COMMUNITY): Payer: BC Managed Care – PPO

## 2013-09-17 ENCOUNTER — Ambulatory Visit (HOSPITAL_COMMUNITY): Payer: BC Managed Care – PPO

## 2013-09-17 ENCOUNTER — Ambulatory Visit (HOSPITAL_COMMUNITY)
Admission: RE | Admit: 2013-09-17 | Discharge: 2013-09-17 | Disposition: A | Discharge status: Home / Self Care | Payer: BC Managed Care – PPO | Source: Ambulatory Visit | Attending: Family Medicine | Admitting: Family Medicine

## 2013-09-17 DIAGNOSIS — M533 Sacrococcygeal disorders, not elsewhere classified: Secondary | ICD-10-CM

## 2013-09-17 DIAGNOSIS — M545 Low back pain: Secondary | ICD-10-CM

## 2013-09-17 NOTE — Evaluation (Signed)
Physical Therapy Progress Note  Patient Details  Name: Lisa Griffith MRN: [id number] Date of Birth: 1946-06-08  Today's Date: 09/17/2013 Time: 4098-1191 PT Time Calculation (min): 46 min Charges: 1 ROM/1 MMT Manual: 1110-1125 TE: 1125-1150             Visit#: 5 of 8  Re-eval: 10/02/13 Assessment Diagnosis: Low back/Lt hip pain Next MD Visit: Dr. Chong Sicilian - 09/09/13  Authorization: secondary Medicare    Authorization Time Period:    Authorization Visit#: 5 of 10    Subjective Symptoms/Limitations Symptoms: Pt reports that her pain has decreased overall.  Today explains that she has "arthritis" pain to her low back.  Pain Assessment Currently in Pain?: Yes Pain Score: 4  Pain Location: Back Pain Orientation: Lower  Assessment RLE Strength Right Hip Flexion: 4/5 (was 4/5) Right Hip Extension: 3+/5 (was 3/5) Right Hip ABduction: 4/5 (was 3+/5) Right Hip ADduction: 4/5 (was 4/5) LLE Strength Left Hip Flexion: 5/5 (was 4/5) Left Hip Extension: 3+/5 (was 3/5) Left Hip ABduction: 4/5 (was 4/5) Left Hip ADduction: 4/5 (was 4/5) Lumbar AROM Lumbar Flexion: WNL - slow movement (was decreased 40% - increased pain) Lumbar Extension: WNL no pain (was WNL - decreased pain) Lumbar - Right Side Bend: WNL - no pain (was decreased 50% - significant pain) Lumbar - Left Side Bend: decreased 25%- pain (was decreased 25% - mild pain) Lumbar - Right Rotation: WNL - no pain (was decreased 15% increased pain) Lumbar - Left Rotation: WNL - no pain (was decreased 10% pain)  Mobility/Balance  Ambulation/Gait Ambulation/Gait: Yes Assistive device: None   Exercise/Treatments Stretches Piriformis Stretch: 3 reps;30 seconds Piriformis Stretch Limitations: RLE Aerobic Tread Mill: 11 minutes level surface 1.6 mph Standing Functional Squats: Limitations;15 reps Functional Squats Limitations: PT facilaition for proper technique Side Lunge: 10 reps Side Lunge Limitations: PT  facilaition for technique Row: Both;15 reps;Theraband;Limitations Theraband Level (Row): Level 3 (Green) Row Limitations: PT faciliation Shoulder Extension: Both;15 reps;Theraband;Limitations Theraband Level (Shoulder Extension): Level 3 (Green) Shoulder Extension Limitations: PT facilitaiotn Shoulder ADduction: Both;15 reps;Theraband;Limitations Theraband Level (Shoulder Adduction): Level 3 (Green) Shoulder Adduction Limitations: PT faciliation Sidelying Hip Abduction: 15 reps;Limitations Hip Abduction Limitations: BLE Other Sidelying Lumbar Exercises: Hip circles BLE: forward and backwards with MinA for RLE x10 reps Prone  Opposite Arm/Leg Raise: Right arm/Left leg;Left arm/Right leg;5 reps  Manual Therapy Other Manual Therapy: Strain Counter Strain (SCS) to Rt piriformis and gluteal musculature with soft tissue massage after to gluteal and lumbar region to decrease pain, muscle spasms and improve local circulation.   Physical Therapy Assessment and Plan PT Assessment and Plan Clinical Impression Statement: Ms. Base has attended 5 OP PT visits over the past 3 weeks with the following findings: has improved by 1/2 muscle grade to hip musculature, improved postural mechanics and gait mechanics, SI joint remains in alignment, improved core coordinated movements, and has centralized pain to Rt piriformis and moderate muscle spasms.  Continued to advance core strengthening exercises to assist with her mechanical low back pain. Pt will benefit from skilled therapeutic intervention in order to improve on the following deficits: Pain;Decreased strength;Impaired perceived functional ability;Decreased range of motion;Increased fascial restricitons;Increased muscle spasms PT Frequency: Min 2X/week PT Duration:  (2 weeekd) PT Treatment/Interventions: Neuromuscular re-education;Therapeutic activities;Therapeutic exercise PT Plan: [facility] note for MD apt on Friday; MET for SI correction as  needed. Add hip hikes and stair training (as knee allows)    Goals Home Exercise Program Pt/caregiver will Perform Home Exercise Program: Independently PT Goal: Perform  Home Exercise Program - Progress: Met PT Short Term Goals Time to Complete Short Term Goals: 2 weeks PT Short Term Goal 1: Pt will report pain to her Lt hip region less than a 2/10 to improve QOL.  PT Short Term Goal 1 - Progress: Progressing toward goal PT Short Term Goal 2: Pt will be educated on and verabalize proper posture.  PT Short Term Goal 2 - Progress: Met PT Short Term Goal 3: Pt will improve BLE strength by 1 muscle grade to maintain SI alignment.  PT Short Term Goal 3 - Progress: Met PT Short Term Goal 4: Pt will be educated on how to self correct SI alignment.  PT Short Term Goal 4 - Progress: Met PT Long Term Goals Time to Complete Long Term Goals: 4 weeks PT Long Term Goal 1: Pt will present with normalized SI alignment in order to report 0/10 pain to her Lt hip when sitting and walking. PT Long Term Goal 1 - Progress: Progressing toward goal PT Long Term Goal 2: Pt will improve her postural strength to WNL in order to ambulate with appropriate gait mechanics to decrease risk of secondary injury.  PT Long Term Goal 2 - Progress: Progressing toward goal Long Term Goal 3: Pt will improve her proprioceptive awareness and independently demonstrate proper sitting and standing posture in order to safely return to her exercise routine for weight loss.  Long Term Goal 3 Progress: Progressing toward goal Long Term Goal 4: Pt will improve her FOTO to status greater than 63% and Limitation less than 37% for improved perceived functional ability.  Long Term Goal 4 Progress: Progressing toward goal  Problem List Patient Active Problem List   Diagnosis Date Noted  . 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 Low back pain 09/02/2013    PT - End of Session Activity Tolerance: Patient tolerated treatment well PT Plan of  Care PT Home Exercise Plan: updated with Red theraband postural strengthenign and provided red t-band PT Patient Instructions: encouraged to return to the gym slowly, educated on importance of proper core coodination and strength.  Consulted and Agree with Plan of Care: Patient  GP    Annett Fabian, MPT, ATC 09/17/2013, 1:02 PM  Physician Documentation Your signature is required to indicate approval of the treatment plan as stated above.  Please sign and either send electronically or make a copy of this report for your files and return this physician signed original.   Please mark one 1.__approve of plan  2. ___approve of plan with the following conditions.   ______________________________                                                          _____________________ Physician Signature  Date  

## 2013-09-18 ENCOUNTER — Ambulatory Visit (HOSPITAL_COMMUNITY): Payer: BC Managed Care – PPO | Admitting: Physical Therapy

## 2013-09-22 ENCOUNTER — Ambulatory Visit (HOSPITAL_COMMUNITY): Payer: BC Managed Care – PPO | Admitting: Physical Therapy

## 2013-09-22 ENCOUNTER — Telehealth (HOSPITAL_COMMUNITY): Payer: Self-pay

## 2013-09-23 ENCOUNTER — Ambulatory Visit (HOSPITAL_COMMUNITY): Payer: BC Managed Care – PPO | Admitting: Physical Therapy

## 2013-09-24 ENCOUNTER — Ambulatory Visit (HOSPITAL_COMMUNITY): Payer: BC Managed Care – PPO | Admitting: Physical Therapy

## 2013-09-24 ENCOUNTER — Telehealth (HOSPITAL_COMMUNITY): Payer: Self-pay

## 2013-09-25 ENCOUNTER — Ambulatory Visit (HOSPITAL_COMMUNITY): Payer: BC Managed Care – PPO

## 2013-12-18 ENCOUNTER — Other Ambulatory Visit (HOSPITAL_COMMUNITY): Payer: Self-pay | Admitting: Family Medicine

## 2013-12-18 ENCOUNTER — Ambulatory Visit (HOSPITAL_COMMUNITY)
Admission: RE | Admit: 2013-12-18 | Discharge: 2013-12-18 | Disposition: A | Discharge status: Home / Self Care | Payer: Medicare Other | Source: Ambulatory Visit | Attending: Family Medicine | Admitting: Family Medicine

## 2013-12-18 DIAGNOSIS — J069 Acute upper respiratory infection, unspecified: Secondary | ICD-10-CM

## 2013-12-18 DIAGNOSIS — R059 Cough, unspecified: Secondary | ICD-10-CM | POA: Insufficient documentation

## 2013-12-18 DIAGNOSIS — R5383 Other fatigue: Secondary | ICD-10-CM

## 2013-12-18 DIAGNOSIS — J209 Acute bronchitis, unspecified: Secondary | ICD-10-CM

## 2013-12-18 DIAGNOSIS — R0989 Other specified symptoms and signs involving the circulatory and respiratory systems: Secondary | ICD-10-CM | POA: Insufficient documentation

## 2013-12-18 DIAGNOSIS — R5381 Other malaise: Secondary | ICD-10-CM | POA: Insufficient documentation

## 2013-12-18 DIAGNOSIS — R05 Cough: Secondary | ICD-10-CM | POA: Insufficient documentation

## 2014-12-04 DIAGNOSIS — M5416 Radiculopathy, lumbar region: Secondary | ICD-10-CM | POA: Diagnosis not present

## 2015-01-05 ENCOUNTER — Encounter (HOSPITAL_COMMUNITY): Payer: Self-pay | Admitting: Emergency Medicine

## 2015-01-05 ENCOUNTER — Emergency Department (HOSPITAL_COMMUNITY): Payer: Medicare Other

## 2015-01-05 ENCOUNTER — Emergency Department (HOSPITAL_COMMUNITY)
Admission: EM | Admit: 2015-01-05 | Discharge: 2015-01-05 | Disposition: A | Discharge status: Home / Self Care | Payer: Medicare Other | Attending: Emergency Medicine | Admitting: Emergency Medicine

## 2015-01-05 DIAGNOSIS — I2510 Atherosclerotic heart disease of native coronary artery without angina pectoris: Secondary | ICD-10-CM | POA: Diagnosis not present

## 2015-01-05 DIAGNOSIS — M47816 Spondylosis without myelopathy or radiculopathy, lumbar region: Secondary | ICD-10-CM | POA: Diagnosis not present

## 2015-01-05 DIAGNOSIS — Z7982 Long term (current) use of aspirin: Secondary | ICD-10-CM | POA: Insufficient documentation

## 2015-01-05 DIAGNOSIS — Z87828 Personal history of other (healed) physical injury and trauma: Secondary | ICD-10-CM | POA: Diagnosis not present

## 2015-01-05 DIAGNOSIS — Z87891 Personal history of nicotine dependence: Secondary | ICD-10-CM | POA: Insufficient documentation

## 2015-01-05 DIAGNOSIS — I10 Essential (primary) hypertension: Secondary | ICD-10-CM | POA: Insufficient documentation

## 2015-01-05 DIAGNOSIS — Z9889 Other specified postprocedural states: Secondary | ICD-10-CM | POA: Insufficient documentation

## 2015-01-05 DIAGNOSIS — M545 Low back pain, unspecified: Secondary | ICD-10-CM

## 2015-01-05 DIAGNOSIS — Z8719 Personal history of other diseases of the digestive system: Secondary | ICD-10-CM | POA: Insufficient documentation

## 2015-01-05 DIAGNOSIS — Z7952 Long term (current) use of systemic steroids: Secondary | ICD-10-CM | POA: Insufficient documentation

## 2015-01-05 DIAGNOSIS — R3915 Urgency of urination: Secondary | ICD-10-CM | POA: Diagnosis not present

## 2015-01-05 LAB — CBC WITH DIFFERENTIAL/PLATELET
BASOS PCT: 1 % (ref 0–1)
Basophils Absolute: 0.1 10*3/uL (ref 0.0–0.1)
Eosinophils Absolute: 0.4 10*3/uL (ref 0.0–0.7)
Eosinophils Relative: 5 % (ref 0–5)
HCT: 42.9 % (ref 36.0–46.0)
HEMOGLOBIN: 14.3 g/dL (ref 12.0–15.0)
Lymphocytes Relative: 34 % (ref 12–46)
Lymphs Abs: 2.4 10*3/uL (ref 0.7–4.0)
MCH: 30.4 pg (ref 26.0–34.0)
MCHC: 33.3 g/dL (ref 30.0–36.0)
MCV: 91.1 fL (ref 78.0–100.0)
MONOS PCT: 8 % (ref 3–12)
Monocytes Absolute: 0.6 10*3/uL (ref 0.1–1.0)
NEUTROS ABS: 3.6 10*3/uL (ref 1.7–7.7)
NEUTROS PCT: 52 % (ref 43–77)
Platelets: 228 10*3/uL (ref 150–400)
RBC: 4.71 MIL/uL (ref 3.87–5.11)
RDW: 12.9 % (ref 11.5–15.5)
WBC: 7.1 10*3/uL (ref 4.0–10.5)

## 2015-01-05 LAB — URINALYSIS, ROUTINE W REFLEX MICROSCOPIC
BILIRUBIN URINE: NEGATIVE
Glucose, UA: NEGATIVE mg/dL
Hgb urine dipstick: NEGATIVE
Ketones, ur: NEGATIVE mg/dL
Leukocytes, UA: NEGATIVE
Nitrite: NEGATIVE
PH: 7 (ref 5.0–8.0)
Protein, ur: NEGATIVE mg/dL
SPECIFIC GRAVITY, URINE: 1.015 (ref 1.005–1.030)
UROBILINOGEN UA: 0.2 mg/dL (ref 0.0–1.0)

## 2015-01-05 LAB — BASIC METABOLIC PANEL
ANION GAP: 4 — AB (ref 5–15)
BUN: 10 mg/dL (ref 6–23)
CHLORIDE: 104 mmol/L (ref 96–112)
CO2: 31 mmol/L (ref 19–32)
CREATININE: 0.63 mg/dL (ref 0.50–1.10)
Calcium: 9.4 mg/dL (ref 8.4–10.5)
GFR calc Af Amer: >90 mL/min (ref >90)
GFR calc non Af Amer: 89 mL/min — ABNORMAL LOW (ref >90)
GLUCOSE: 97 mg/dL (ref 70–99)
Potassium: 3.4 mmol/L — ABNORMAL LOW (ref 3.5–5.1)
Sodium: 139 mmol/L (ref 135–145)

## 2015-01-05 MED ORDER — HYDROMORPHONE HCL 1 MG/ML IJ SOLN
1.0000 mg | Freq: Once | INTRAMUSCULAR | Status: AC
  Administered 2015-01-05: 1 mg via INTRAVENOUS
  Filled 2015-01-05: qty 1

## 2015-01-05 MED ORDER — GADOBENATE DIMEGLUMINE 529 MG/ML IV SOLN
18.0000 mL | Freq: Once | INTRAVENOUS | Status: AC | PRN
  Administered 2015-01-05: 18 mL via INTRAVENOUS

## 2015-01-05 MED ORDER — PREDNISONE 20 MG PO TABS: ORAL_TABLET | ORAL | Status: DC

## 2015-01-05 MED ORDER — OXYCODONE-ACETAMINOPHEN 5-325 MG PO TABS: 1.0000 | ORAL_TABLET | ORAL | Status: DC | PRN

## 2015-01-05 MED ORDER — ONDANSETRON HCL 4 MG/2ML IJ SOLN
4.0000 mg | Freq: Once | INTRAMUSCULAR | Status: AC
  Administered 2015-01-05: 4 mg via INTRAVENOUS
  Filled 2015-01-05: qty 2

## 2015-01-05 MED ORDER — SODIUM CHLORIDE 0.9 % IV SOLN
Freq: Once | INTRAVENOUS | Status: AC
  Administered 2015-01-05: 09:00:00 via INTRAVENOUS

## 2015-01-05 NOTE — ED Notes (Signed)
Pt c/o back pain that began last night. Had epidural on 1/8.

## 2015-01-05 NOTE — ED Provider Notes (Signed)
CSN: [id number]     Arrival date & time 01/05/15  2993 History  This chart was scribed for Orpah Greek, MD by Edison Simon, ED Scribe. This patient was seen in room APA11/APA11 and the patient's care was started at 8:12 AM.    Chief Complaint  Patient presents with  . Back Pain   The history is provided by the patient and the spouse. No language interpreter was used.    HPI Comments: Lisa Griffith is a 69 y.o. female with history of back pain who presents to the Emergency Department complaining of right-sided back pain radiating down right leg with onset last night. She states it feels like there is a knot there. She notes she had an epidural on 12/04/2014 for this pain and notes prior epidural in 2014. She states she is also using muscle relaxer. Husband states this has been an ongoing problem for 35 years. She states she last had MRI in 2014. She states she has not had any surgery on her back. She states that since last night, she has difficulty getting to the restroom before she urinates. She denies fever.   Past Medical History  Diagnosis Date  . Hypertension   . Stroke     "mild"   . Coronary artery disease   . GERD (gastroesophageal reflux disease)   . Back muscle spasm   . Headache(784.0)   . Seasonal allergies   . Ruptured lumbar disc    Past Surgical History  Procedure Laterality Date  . Cataract extraction, bilateral  2008    [place]  . Cardiac catheterization  12/2008    [facility]  . Posterior laminectomy / decompression cervical spine      [place]  . Colonoscopy  01/16/2012    Procedure: COLONOSCOPY;  Surgeon: Jamesetta So, MD;  Location: [facility];  Service: Gastroenterology;  Laterality: N/A;   Family History  Problem Relation Age of Onset  . Cancer Mother   . Cancer Father    History  Substance Use Topics  . Smoking status: Former Smoker    Types: Cigarettes  . Smokeless tobacco: Never Used  . Alcohol Use: No     Comment: occasional  wine/beer   OB History    Gravida Para Term Preterm AB TAB SAB Ectopic Multiple Living   5 3 3  2  2         Review of Systems  Constitutional: Negative for fever.  Genitourinary: Positive for urgency.  Musculoskeletal: Positive for back pain.  All other systems reviewed and are negative.     Allergies  Statins  Home Medications   Prior to Admission medications   Medication Sig Start Date End Date Taking? Authorizing Provider  aspirin EC 81 MG tablet Take 81 mg by mouth daily.   Yes Historical Provider, MD  carvedilol (COREG) 6.25 MG tablet Take 6.25 mg by mouth 2 (two) times daily with a meal.   Yes Historical Provider, MD  cyclobenzaprine (FLEXERIL) 10 MG tablet Take 10 mg by mouth 3 (three) times daily as needed for muscle spasms.   Yes Historical Provider, MD  lisinopril-hydrochlorothiazide (PRINZIDE,ZESTORETIC) 20-25 MG per tablet Take 1 tablet by mouth daily.   Yes Historical Provider, MD  pravastatin (PRAVACHOL) 20 MG tablet Take 20 mg by mouth daily.   Yes Historical Provider, MD  predniSONE (DELTASONE) 10 MG tablet Take 6 tablets day one, 5 tablets day two, 4 tablets day three, 3 tablets day four, 2 tablets day five, then 1  tablet day six Patient not taking: Reported on 01/05/2015 07/12/13   Tammy L. Triplett, PA-C  traMADol (ULTRAM) 50 MG tablet Take 1 tablet (50 mg total) by mouth every 6 (six) hours as needed for pain. Patient not taking: Reported on 01/05/2015 07/12/13   Tammy L. Triplett, PA-C   BP 132/76 mmHg  Pulse 95  Temp(Src) 97.8 F (36.6 C) (Oral)  Resp 18  Ht 5\' 2"  (1.575 m)  Wt 190 lb (86.183 kg)  BMI 34.74 kg/m2  SpO2 93% Physical Exam  Constitutional: She is oriented to person, place, and time. She appears well-developed and well-nourished. No distress.  HENT:  Head: Normocephalic and atraumatic.  Right Ear: Hearing normal.  Left Ear: Hearing normal.  Nose: Nose normal.  Mouth/Throat: Oropharynx is clear and moist and mucous membranes are normal.   Eyes: Conjunctivae and EOM are normal. Pupils are equal, round, and reactive to light.  Neck: Normal range of motion. Neck supple.  Cardiovascular: Regular rhythm, S1 normal and S2 normal.  Exam reveals no gallop and no friction rub.   No murmur heard. Pulmonary/Chest: Effort normal and breath sounds normal. No respiratory distress. She exhibits no tenderness.  Abdominal: Soft. Normal appearance and bowel sounds are normal. There is no hepatosplenomegaly. There is no tenderness. There is no rebound, no guarding, no tenderness at McBurney's point and negative Murphy's sign. No hernia.  Musculoskeletal: Normal range of motion.  No decreased motor Normal sensation Some pain with ROM of right hip Pain in right lower back  Neurological: She is alert and oriented to person, place, and time. She has normal strength. No cranial nerve deficit or sensory deficit. Coordination normal. GCS eye subscore is 4. GCS verbal subscore is 5. GCS motor subscore is 6.  Patient is able to move both legs without difficulty. There is no sensory or motor abnormality on examination.  Skin: Skin is warm, dry and intact. No rash noted. No cyanosis.  Psychiatric: She has a normal mood and affect. Her speech is normal and behavior is normal. Thought content normal.  Nursing note and vitals reviewed.   ED Course  Procedures (including critical care time)  DIAGNOSTIC STUDIES: Oxygen Saturation is 98% on room air, normal by my interpretation.    COORDINATION OF CARE: 8:17 AM Discussed treatment plan with patient at beside, including MRI and pain medication here. The patient agrees with the plan and has no further questions at this time.   Labs Review Labs Reviewed  BASIC METABOLIC PANEL - Abnormal; Notable for the following:    Potassium 3.4 (*)    GFR calc non Af Amer 89 (*)    Anion gap 4 (*)    All other components within normal limits  CBC WITH DIFFERENTIAL/PLATELET  URINALYSIS, ROUTINE W REFLEX MICROSCOPIC     Imaging Review Mr Lumbar Spine W Wo Contrast  01/05/2015   CLINICAL DATA:  Chronic low back pain radiating into the right leg. Status post epidural steroid injections 12/04/2014.  EXAM: MRI LUMBAR SPINE WITHOUT AND WITH CONTRAST  TECHNIQUE: Multiplanar and multiecho pulse sequences of the lumbar spine were obtained without and with intravenous contrast.  CONTRAST:  18 mL MULTIHANCE GADOBENATE DIMEGLUMINE 529 MG/ML IV SOLN  COMPARISON:  MRI lumbar spine 07/23/2013.  FINDINGS: There is convex left scoliosis. Prominent Schmorl's nodes in the superior endplates of H84 and L1 are unchanged with associated wedging of both vertebral bodies, worse in L1. Trace anterolisthesis T11 on T12, 0.3 cm retrolisthesis L1 on L2 and 0.3 cm anterolisthesis  L5 on S1 are unchanged. Alignment is otherwise normal. The conus medullaris is normal in signal and position. Imaged intra-abdominal contents demonstrate a large cyst off the lower pole of the right kidney, unchanged.  The T11-12 level is imaged in the sagittal plane only. The central canal and foramina appear open. No disc bulge or protrusion.  T12-L1: The disc is slightly uncovered with a minimal bulge to the right but the central canal and foramina are open.  L1-2: Right worse than left facet degenerative disease is identified. There is a shallow disc bulge and right foraminal protrusion. Severe right foraminal stenosis appears worsened since the prior examination. There is moderate left foraminal narrowing. The central canal and right lateral recess are mildly narrowed.  L2-3: Shallow disc bulge and small right side protrusion at L2-3 do not appear changed but do result in marked right foraminal narrowing and mild narrowing in the right lateral recess at this level.  L3-4: Disc bulge eccentric to the left causing moderate to moderately severe central canal stenosis and left worse than right lateral recess narrowing appears unchanged. The right foramen is open. Mild left  foraminal narrowing is identified.  L4-5: Annular fissure and a shallow central protrusion resulting in narrowing in the lateral recesses, worse on the left, appears unchanged. The foramina are open.  L5-S1: Advanced bilateral facet arthropathy is identified. The disc is uncovered with a slight bulge but the central canal is widely patent. Left worse than right foraminal narrowing is unchanged.  IMPRESSION: Since the prior study, there has been progression of severe right foraminal narrowing at L1-2 due to disc and facet degenerative disease with encroachment on the exiting right L1 root. Mild right lateral recess narrowing is also present at this level.  No change in a disc bulge eccentric to the left at L3-4 causing moderate central canal stenosis and left worse than right lateral recess narrowing.  No change in left worse than right lateral recess narrowing L4-5 due to a shallow central protrusion.  No change in marked left foraminal narrowing L5-S1.   Electronically Signed   By: Inge Rise M.D.   On: 01/05/2015 10:31     EKG Interpretation None      MDM   Final diagnoses:  Lumbar pain    Patient presents to the ER for evaluation of back pain. Patient complaining of severe right sided lower back pain. Patient does have a history of chronic back pain for 20 or 30 years. She did, however, have an epidural injection one month ago. She is not experiencing any fever. She does not have any focal neurologic findings. Because she recently had the injection, however, it was felt that MRI would be important to perform today to rule out infection. No signs of discitis or epidural abscess are seen. She does have worsening right foraminal narrowing at L1-2, but there are no neurosurgical emergencies noted.  Patient has been using Flexeril for pain at home. She will require increased pain medication and is to follow-up with her physicians as she will need to follow-up with a spinal surgeon in the near  future.  I personally performed the services described in this documentation, which was scribed in my presence. The recorded information has been reviewed and is accurate.   Orpah Greek, MD 01/05/15 1140

## 2015-01-05 NOTE — Discharge Instructions (Signed)
Back Pain, Adult Low back pain is very common. About 1 in 5 people have back pain.The cause of low back pain is rarely dangerous. The pain often gets better over time.About half of people with a sudden onset of back pain feel better in just 2 weeks. About 8 in 10 people feel better by 6 weeks.  CAUSES Some common causes of back pain include:  Strain of the muscles or ligaments supporting the spine.  Wear and tear (degeneration) of the spinal discs.  Arthritis.  Direct injury to the back. DIAGNOSIS Most of the time, the direct cause of low back pain is not known.However, back pain can be treated effectively even when the exact cause of the pain is unknown.Answering your caregiver's questions about your overall health and symptoms is one of the most accurate ways to make sure the cause of your pain is not dangerous. If your caregiver needs more information, he or she may order lab work or imaging tests (X-rays or MRIs).However, even if imaging tests show changes in your back, this usually does not require surgery. HOME CARE INSTRUCTIONS For many people, back pain returns.Since low back pain is rarely dangerous, it is often a condition that people can learn to manageon their own.   Remain active. It is stressful on the back to sit or stand in one place. Do not sit, drive, or stand in one place for more than 30 minutes at a time. Take short walks on level surfaces as soon as pain allows.Try to increase the length of time you walk each day.  Do not stay in bed.Resting more than 1 or 2 days can delay your recovery.  Do not avoid exercise or work.Your body is made to move.It is not dangerous to be active, even though your back may hurt.Your back will likely heal faster if you return to being active before your pain is gone.  Pay attention to your body when you bend and lift. Many people have less discomfortwhen lifting if they bend their knees, keep the load close to their bodies,and  avoid twisting. Often, the most comfortable positions are those that put less stress on your recovering back.  Find a comfortable position to sleep. Use a firm mattress and lie on your side with your knees slightly bent. If you lie on your back, put a pillow under your knees.  Only take over-the-counter or prescription medicines as directed by your caregiver. Over-the-counter medicines to reduce pain and inflammation are often the most helpful.Your caregiver may prescribe muscle relaxant drugs.These medicines help dull your pain so you can more quickly return to your normal activities and healthy exercise.  Put ice on the injured area.  Put ice in a plastic bag.  Place a towel between your skin and the bag.  Leave the ice on for 15-20 minutes, 03-04 times a day for the first 2 to 3 days. After that, ice and heat may be alternated to reduce pain and spasms.  Ask your caregiver about trying back exercises and gentle massage. This may be of some benefit.  Avoid feeling anxious or stressed.Stress increases muscle tension and can worsen back pain.It is important to recognize when you are anxious or stressed and learn ways to manage it.Exercise is a great option. SEEK MEDICAL CARE IF:  You have pain that is not relieved with rest or medicine.  You have pain that does not improve in 1 week.  You have new symptoms.  You are generally not feeling well. SEEK   IMMEDIATE MEDICAL CARE IF:   You have pain that radiates from your back into your legs.  You develop new bowel or bladder control problems.  You have unusual weakness or numbness in your arms or legs.  You develop nausea or vomiting.  You develop abdominal pain.  You feel faint. Document Released: 11/13/2005 Document Revised: 05/14/2012 Document Reviewed: 03/17/2014 Exit[employer] Patient Information 2015 [employer]. This information is not intended to replace advice given to you by your health care provider. Make sure you  discuss any questions you have with your health care provider.  

## 2015-01-06 DIAGNOSIS — M47817 Spondylosis without myelopathy or radiculopathy, lumbosacral region: Secondary | ICD-10-CM | POA: Diagnosis not present

## 2015-01-06 DIAGNOSIS — M545 Low back pain: Secondary | ICD-10-CM | POA: Diagnosis not present

## 2015-01-14 ENCOUNTER — Telehealth: Payer: Self-pay | Admitting: Cardiovascular Disease

## 2015-01-14 DIAGNOSIS — M47817 Spondylosis without myelopathy or radiculopathy, lumbosacral region: Secondary | ICD-10-CM | POA: Diagnosis not present

## 2015-01-14 DIAGNOSIS — M545 Low back pain: Secondary | ICD-10-CM | POA: Diagnosis not present

## 2015-01-14 NOTE — Telephone Encounter (Signed)
Pt called in stating that she has been having some back pain and she has spoken to her Orthopedic doctor who is wanting to do surgery but she is needing to be cleared first. She is wanting to be seen sooner. Please f/u  Thanks

## 2015-01-14 NOTE — Telephone Encounter (Signed)
Pt has appt on 3/15 to establish care w/ Dr. Gwenlyn Found.   Remote history of evaluation by Dr. Gwenlyn Found for stroke related symptoms from several years ago.  Advised that surgical clearance can be looked into at her March appt.

## 2015-01-19 DIAGNOSIS — E6609 Other obesity due to excess calories: Secondary | ICD-10-CM | POA: Diagnosis not present

## 2015-01-19 DIAGNOSIS — M5136 Other intervertebral disc degeneration, lumbar region: Secondary | ICD-10-CM | POA: Diagnosis not present

## 2015-01-19 DIAGNOSIS — Z6833 Body mass index (BMI) 33.0-33.9, adult: Secondary | ICD-10-CM | POA: Diagnosis not present

## 2015-01-27 ENCOUNTER — Encounter: Payer: Self-pay | Admitting: Cardiology

## 2015-01-27 ENCOUNTER — Encounter: Payer: Self-pay | Admitting: *Deleted

## 2015-01-27 ENCOUNTER — Ambulatory Visit (INDEPENDENT_AMBULATORY_CARE_PROVIDER_SITE_OTHER): Payer: Medicare Other | Admitting: Cardiology

## 2015-01-27 VITALS — BP 151/86 | HR 96 | Ht 62.0 in | Wt 181.0 lb

## 2015-01-27 DIAGNOSIS — I10 Essential (primary) hypertension: Secondary | ICD-10-CM | POA: Diagnosis not present

## 2015-01-27 DIAGNOSIS — Z136 Encounter for screening for cardiovascular disorders: Secondary | ICD-10-CM | POA: Diagnosis not present

## 2015-01-27 DIAGNOSIS — I2510 Atherosclerotic heart disease of native coronary artery without angina pectoris: Secondary | ICD-10-CM

## 2015-01-27 DIAGNOSIS — E785 Hyperlipidemia, unspecified: Secondary | ICD-10-CM

## 2015-01-27 DIAGNOSIS — Z01810 Encounter for preprocedural cardiovascular examination: Secondary | ICD-10-CM | POA: Diagnosis not present

## 2015-01-27 NOTE — Patient Instructions (Signed)
Your physician wants you to follow-up in: [place] DR. BRANCH You will receive a reminder letter in the mail two months in advance. If you don't receive a letter, please call our office to schedule the follow-up appointment.  Your physician recommends that you continue on your current medications as directed. Please refer to the Current Medication list given to you today.  Your physician has requested that you have a lexiscan myoview. For further information please visit [email or web address]. Please follow instruction sheet, as given.  Your physician has requested that you regularly monitor and record your blood pressure readings at home FOR 2 WEEKS AND BRING OR MAIL TO OFFICE. Please use the same machine at the same time of day to check your readings and record them to bring to your follow-up visit.  WE WILL REQUEST LABS FROM DR. GOLDING  Thank you for choosing [facility]!!

## 2015-01-27 NOTE — Progress Notes (Addendum)
Clinical Summary Lisa Griffith is a 69 y.o.female seen today as a new patient for the following medical problems.   1. CAD - cath 2010 with 70% ramus stenosis, otherwise patent vessels. LVEF 65% - denies any recent chest pain. No SOB, no DOE. No LE edema. - preop for possible back surgery. Exertion limited by right leg pain. Cannot walk distances at all due to leg pain.     2. HTN - checks at home daily. Typically around 150s/80s  3. Hx of CVA - episode 06/2009 - has been on plavix for secondary prevetnion  4. HL - lipitor caused muscle aches. Tolerates pravastatin well.    Past Medical History  Diagnosis Date  . Hypertension   . Stroke     "mild"   . Coronary artery disease   . GERD (gastroesophageal reflux disease)   . Back muscle spasm   . Headache(784.0)   . Seasonal allergies   . Ruptured lumbar disc      Allergies  Allergen Reactions  . Statins     Pt unsure of reaction      Current Outpatient Prescriptions  Medication Sig Dispense Refill  . aspirin EC 81 MG tablet Take 81 mg by mouth daily.    . carvedilol (COREG) 6.25 MG tablet Take 6.25 mg by mouth 2 (two) times daily with a meal.    . cyclobenzaprine (FLEXERIL) 10 MG tablet Take 10 mg by mouth 3 (three) times daily as needed for muscle spasms.    Marland Kitchen lisinopril-hydrochlorothiazide (PRINZIDE,ZESTORETIC) 20-25 MG per tablet Take 1 tablet by mouth daily.    Marland Kitchen oxyCODONE-acetaminophen (PERCOCET) 5-325 MG per tablet Take 1 tablet by mouth every 4 (four) hours as needed. 20 tablet 0  . pravastatin (PRAVACHOL) 20 MG tablet Take 20 mg by mouth daily.    . predniSONE (DELTASONE) 20 MG tablet 3 tabs po daily x 3 days, then 2 tabs x 3 days, then 1.5 tabs x 3 days, then 1 tab x 3 days, then 0.5 tabs x 3 days 27 tablet 0   No current facility-administered medications for this visit.     Past Surgical History  Procedure Laterality Date  . Cataract extraction, bilateral  2008    [place]  . Cardiac  catheterization  12/2008    [facility]  . Posterior laminectomy / decompression cervical spine      [place]  . Colonoscopy  01/16/2012    Procedure: COLONOSCOPY;  Surgeon: Jamesetta So, MD;  Location: [facility];  Service: Gastroenterology;  Laterality: N/A;     Allergies  Allergen Reactions  . Statins     Pt unsure of reaction       Family History  Problem Relation Age of Onset  . Cancer Mother   . Cancer Father      Social History Lisa Griffith reports that she has quit smoking. Her smoking use included Cigarettes. She has never used smokeless tobacco. Lisa Griffith reports that she does not drink alcohol.   Review of Systems CONSTITUTIONAL: No weight loss, fever, chills, weakness or fatigue.  HEENT: Eyes: No visual loss, blurred vision, double vision or yellow sclerae.No hearing loss, sneezing, congestion, runny nose or sore throat.  SKIN: No rash or itching.  CARDIOVASCULAR: per HPI RESPIRATORY: No shortness of breath, cough or sputum.  GASTROINTESTINAL: No anorexia, nausea, vomiting or diarrhea. No abdominal pain or blood.  GENITOURINARY: No burning on urination, no polyuria NEUROLOGICAL: No headache, dizziness, syncope, paralysis, ataxia, numbness or tingling in  the extremities. No change in bowel or bladder control.  MUSCULOSKELETAL: leg pain LYMPHATICS: No enlarged nodes. No history of splenectomy.  PSYCHIATRIC: No history of depression or anxiety.  ENDOCRINOLOGIC: No reports of sweating, cold or heat intolerance. No polyuria or polydipsia.  Marland Kitchen   Physical Examination p 96 bp 151/86 Wt 181 lbs BMI 33 Gen: resting comfortably, no acute distress HEENT: no scleral icterus, pupils equal round and reactive, no palptable cervical adenopathy,  CV: RRR, no m/r/g, no JVD Resp: Clear to auscultation bilaterally GI: abdomen is soft, non-tender, non-distended, normal bowel sounds, no hepatosplenomegaly MSK: extremities are warm, no edema.  Skin: warm, no rash Neuro:  no  focal deficits Psych: appropriate affect   Diagnostic Studies 12/2008 Cath HEMODYNAMIC RESULTS: 1. Aortic systolic pressure 937, diastolic pressure 56. 2. Left ventricular systolic pressure 902, end-diastolic pressure 5.  SELECTIVE CHOLANGIOGRAPHY: 1. Left main normal. 2. LAD normal. 3. Left circumflex normal. 4. Ramus intermedius Lisa Griffith which arose as a high marginal was  moderately sized and had a 70% fairly focal proximal stenosis. 5. Right coronary was dominant normal. 6. Left ventriculography; RAO left ventriculogram was performed using  25 mL of Visipaque dye at 12 mL per second. The overall LVEF was  estimated at greater than 60% without focal wall motion  abnormalities.  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 I do not think this is contributing to her symptoms. Continued medical therapy will be recommended.   06/2009 Carotid US Findings:  RIGHT CAROTID ARTERY: There is a hypoechoic structure in the right thyroid tissue which is predominately cystic and measures up to 1 cm. There may be a small septation within this cyst. The right carotid arteries are patent without significant plaque formation.  RIGHT VERTEBRAL ARTERY: Antegrade flow and normal waveform in the right vertebral artery.  LEFT CAROTID ARTERY: Mild amount plaque in the left common carotid artery. There is plaque involving the left carotid bulb. Prominent plaque in the left carotid bulb and proximal left internal carotid artery. This prominent plaque is likely calcified due to the posterior acoustic shadowing. However, this does not appear to be an occlusive plaque.  LEFT VERTEBRAL ARTERY: Antegrade flow and normal waveform in the left vertebral artery.  IMPRESSION:  Prominent calcific plaque involving the left carotid bulb and the proximal left internal carotid artery. There is no significant internal carotid  artery stenosis. Estimated degree of stenosis in the internal carotid arteries is less than 50%.  Assessment and Plan  1. CAD - no current symptoms, continue risk factor modification  2. HTN - above goal, will keep bp log to monitor home numbers. If elevated titrate meds at that time  3. Hyperlipidemia - request recent labs from pcp. Continue current statin  4. Hx of CVA - has been on plavix for secondary prevention, not for cardiac reason. Continue current meds  5. Preoperative evaluation - unable to assess her functional capacity by history due to severe limitations from leg pain. History of borderline coronary lesion in 2010. WIll obtain Lexiscan to further risk stratify prior to surgery  F/u 6 months      Arnoldo Lenis, M.D.   Addendum 02/02/15  Stress test results are negative. From cardiac standpoint recommend proceeding with surgery as planned.   Zandra Abts MD

## 2015-02-02 ENCOUNTER — Encounter (HOSPITAL_COMMUNITY)
Admission: RE | Admit: 2015-02-02 | Discharge: 2015-02-02 | Disposition: A | Discharge status: Home / Self Care | Payer: Medicare Other | Source: Ambulatory Visit | Attending: Cardiology | Admitting: Cardiology

## 2015-02-02 ENCOUNTER — Ambulatory Visit (HOSPITAL_COMMUNITY)
Admission: RE | Admit: 2015-02-02 | Discharge: 2015-02-02 | Disposition: A | Discharge status: Home / Self Care | Payer: Medicare Other | Source: Ambulatory Visit | Attending: Cardiology | Admitting: Cardiology

## 2015-02-02 ENCOUNTER — Encounter (HOSPITAL_COMMUNITY): Payer: Self-pay

## 2015-02-02 DIAGNOSIS — I2510 Atherosclerotic heart disease of native coronary artery without angina pectoris: Secondary | ICD-10-CM | POA: Insufficient documentation

## 2015-02-02 DIAGNOSIS — Z01810 Encounter for preprocedural cardiovascular examination: Secondary | ICD-10-CM | POA: Insufficient documentation

## 2015-02-02 DIAGNOSIS — Z136 Encounter for screening for cardiovascular disorders: Secondary | ICD-10-CM | POA: Diagnosis not present

## 2015-02-02 MED ORDER — TECHNETIUM TC 99M SESTAMIBI GENERIC - CARDIOLITE: 25.0000 | Freq: Once | INTRAVENOUS | Status: AC | PRN

## 2015-02-02 MED ORDER — REGADENOSON 0.4 MG/5ML IV SOLN
INTRAVENOUS | Status: AC
  Administered 2015-02-02: 0.4 mg via INTRAVENOUS
  Filled 2015-02-02: qty 5

## 2015-02-02 MED ORDER — TECHNETIUM TC 99M SESTAMIBI - CARDIOLITE
30.0000 | Freq: Once | INTRAVENOUS | Status: AC | PRN
  Administered 2015-02-02: 30 via INTRAVENOUS

## 2015-02-02 MED ORDER — REGADENOSON 0.4 MG/5ML IV SOLN: 0.4000 mg | Freq: Once | INTRAVENOUS | Status: AC | PRN

## 2015-02-02 MED ORDER — SODIUM CHLORIDE 0.9 % IJ SOLN
10.0000 mL | INTRAMUSCULAR | Status: DC | PRN
  Filled 2015-02-02: qty 10

## 2015-02-02 MED ORDER — TECHNETIUM TC 99M SESTAMIBI GENERIC - CARDIOLITE
10.0000 | Freq: Once | INTRAVENOUS | Status: AC | PRN
  Administered 2015-02-02: 10 via INTRAVENOUS

## 2015-02-02 MED ORDER — SODIUM CHLORIDE 0.9 % IJ SOLN
INTRAMUSCULAR | Status: AC
  Administered 2015-02-02: 10 mL via INTRAVENOUS
  Filled 2015-02-02: qty 3

## 2015-02-02 NOTE — Progress Notes (Signed)
Stress Lab Nurses Notes - [name]  Lisa Griffith 02/02/2015 Reason for doing test: CAD and Surgical Clearance Type of test: Wille Glaser Nurse performing test: Gerrit Halls, RN Nuclear Medicine Tech: Dyanne Carrel Echo Tech: Not Applicable MD performing test: Branch/K.Purcell Nails NP Family MD: Hilma Favors Test explained and consent signed: Yes.   IV started: Saline lock flushed, No redness or edema and Saline lock started in radiology Symptoms: Chest tightness Treatment/Intervention: None Reason test stopped: protocol completed After recovery IV was: Discontinued via X-ray tech and No redness or edema Patient to return to Nuc. Med at : 11:00 Patient discharged: Home Patient's Condition upon discharge was: stable Comments: During test BP 163/67 & HR 104.  Recovery BP 156/69 & HR 97.  Symptoms resolved in recovery. [name] T

## 2015-02-04 ENCOUNTER — Telehealth: Payer: Self-pay | Admitting: *Deleted

## 2015-02-04 ENCOUNTER — Telehealth: Payer: Self-pay | Admitting: Cardiology

## 2015-02-04 NOTE — Telephone Encounter (Signed)
Patient walked into office requesting that she be contacted with results from stress test she had last week. Waiting on results to be able to proceed with her back surgery with Dr Rosendo Gros. She will be home rest of the day

## 2015-02-04 NOTE — Telephone Encounter (Signed)
Pt made aware, forwarded to Dr. Noe Gens orthopedics.

## 2015-02-04 NOTE — Telephone Encounter (Signed)
Will forward to DR. [employer]

## 2015-02-04 NOTE — Telephone Encounter (Signed)
-----   Message from Arnoldo Lenis, MD sent at 02/04/2015  1:55 PM EST ----- Stress test results from Tuesday are normal. Please forward my updated clinic note with addendum to her surgeon. Surgery is ok from our standpoint.  Zandra Abts MD

## 2015-02-04 NOTE — Telephone Encounter (Signed)
See result note telephone call 02/04/15 Dr. Harl Bowie addressed. Pt made aware

## 2015-02-09 ENCOUNTER — Ambulatory Visit: Payer: Self-pay | Admitting: Cardiovascular Disease

## 2015-02-16 ENCOUNTER — Telehealth: Payer: Self-pay | Admitting: *Deleted

## 2015-02-16 NOTE — Telephone Encounter (Signed)
Pt made aware

## 2015-02-16 NOTE — Telephone Encounter (Signed)
-----   Message from Arnoldo Lenis, MD sent at 02/16/2015 11:13 AM EDT ----- BP log looks good, no med changes  Zandra Abts MD

## 2015-02-17 ENCOUNTER — Other Ambulatory Visit (HOSPITAL_COMMUNITY): Payer: Self-pay | Admitting: Orthopaedic Surgery

## 2015-02-18 DIAGNOSIS — M47817 Spondylosis without myelopathy or radiculopathy, lumbosacral region: Secondary | ICD-10-CM | POA: Diagnosis not present

## 2015-02-18 DIAGNOSIS — M545 Low back pain: Secondary | ICD-10-CM | POA: Diagnosis not present

## 2015-02-23 NOTE — Pre-Procedure Instructions (Addendum)
[name]  02/23/2015   Your procedure is scheduled on: Monday, March 01, 2015  Report to [facility] Admitting at 10:30 AM.   Call this number if you have problems the morning of surgery: [phone]                           For any other questions, please call [phone], Monday - Friday 8 AM - 4 PM.   Remember:   Do not eat food or drink liquids after midnight Sunday, February 28, 2015   Take these medicines the morning of surgery with A SIP OF WATER: carvedilol (COREG).               May take oxyCODONE-acetaminophen [facility]) if needed.  Stop taking Aspirin, vitamins and herbal medications. Do not take any NSAIDs ie: Ibuprofen, Advil, Naproxen or any medication containing Aspirin; stop now.   Do not wear jewelry, make-up or nail polish.  Do not wear lotions, powders, or perfumes. You may not wear deodorant.  Do not shave 48 hours prior to surgery.  Do not bring valuables to the hospital.  [facility] is not responsible for any belongings or valuables.               Contacts, dentures or bridgework may not be worn into surgery.  Leave suitcase in the car. After surgery it may be brought to your room.  For patients admitted to the hospital, discharge time is determined by your treatment team.               Patients discharged the day of surgery will not be allowed to drive home.  Name and phone number of your driver:             Please read over the following fact sheets that you were given: Pain Booklet, Coughing and Deep Breathing, MRSA Information and Surgical Site Infection Prevention and Incentive Spirometer.

## 2015-02-24 ENCOUNTER — Encounter (HOSPITAL_COMMUNITY): Payer: Self-pay

## 2015-02-24 ENCOUNTER — Telehealth: Payer: Self-pay | Admitting: Cardiology

## 2015-02-24 ENCOUNTER — Encounter (HOSPITAL_COMMUNITY)
Admission: RE | Admit: 2015-02-24 | Discharge: 2015-02-24 | Disposition: A | Discharge status: Home / Self Care | Payer: Medicare Other | Source: Ambulatory Visit | Attending: Orthopaedic Surgery | Admitting: Orthopaedic Surgery

## 2015-02-24 DIAGNOSIS — Z01812 Encounter for preprocedural laboratory examination: Secondary | ICD-10-CM | POA: Diagnosis not present

## 2015-02-24 HISTORY — DX: Constipation, unspecified: K59.00

## 2015-02-24 LAB — CBC
HEMATOCRIT: 45.8 % (ref 36.0–46.0)
Hemoglobin: 15.4 g/dL — ABNORMAL HIGH (ref 12.0–15.0)
MCH: 30.4 pg (ref 26.0–34.0)
MCHC: 33.6 g/dL (ref 30.0–36.0)
MCV: 90.3 fL (ref 78.0–100.0)
Platelets: 279 10*3/uL (ref 150–400)
RBC: 5.07 MIL/uL (ref 3.87–5.11)
RDW: 13.2 % (ref 11.5–15.5)
WBC: 9.2 10*3/uL (ref 4.0–10.5)

## 2015-02-24 LAB — COMPREHENSIVE METABOLIC PANEL
ALK PHOS: 66 U/L (ref 39–117)
ALT: 63 U/L — ABNORMAL HIGH (ref 0–35)
ANION GAP: 11 (ref 5–15)
AST: 48 U/L — AB (ref 0–37)
Albumin: 3.9 g/dL (ref 3.5–5.2)
BILIRUBIN TOTAL: 0.9 mg/dL (ref 0.3–1.2)
BUN: 9 mg/dL (ref 6–23)
CHLORIDE: 101 mmol/L (ref 96–112)
CO2: 28 mmol/L (ref 19–32)
CREATININE: 0.72 mg/dL (ref 0.50–1.10)
Calcium: 10.2 mg/dL (ref 8.4–10.5)
GFR calc Af Amer: >90 mL/min (ref >90)
GFR calc non Af Amer: 86 mL/min — ABNORMAL LOW (ref >90)
GLUCOSE: 119 mg/dL — AB (ref 70–99)
Potassium: 3.3 mmol/L — ABNORMAL LOW (ref 3.5–5.1)
SODIUM: 140 mmol/L (ref 135–145)
TOTAL PROTEIN: 6.9 g/dL (ref 6.0–8.3)

## 2015-02-24 LAB — SURGICAL PCR SCREEN
MRSA, PCR: NEGATIVE
STAPHYLOCOCCUS AUREUS: NEGATIVE

## 2015-02-24 LAB — APTT: aPTT: 29 seconds (ref 24–37)

## 2015-02-24 LAB — PROTIME-INR
INR: 0.99 (ref 0.00–1.49)
Prothrombin Time: 13.2 seconds (ref 11.6–15.2)

## 2015-02-24 NOTE — Telephone Encounter (Signed)
Pt c/o dizziness, thinks Coreg is causing this so she stopped taking it 2 days ago and dizziness has stopped. Pt would like to switch Coreg to something else. Pt is having back surgery on Monday is off ASA X 5 days. BP was 150/80 and HR WNL. Pt think BP is elevated due to back pain. Will forward to Dr. Harl Bowie for suggestion on Coreg

## 2015-02-24 NOTE — Progress Notes (Signed)
   02/24/15 0823  OBSTRUCTIVE SLEEP APNEA  Have you ever been diagnosed with sleep apnea through a sleep study? No  Do you snore loudly (loud enough to be heard through closed doors)?  1  Do you often feel tired, fatigued, or sleepy during the daytime? 0  Has anyone observed you stop breathing during your sleep? 0  Do you have, or are you being treated for high blood pressure? 1  BMI more than 35 kg/m2? 0  Age over 70 years old? 1  Neck circumference greater than 40 cm/16 inches? 0 (15)  Gender: 1

## 2015-02-24 NOTE — Telephone Encounter (Signed)
Carvedilol - makes her dizzy having surgery on Monday and would like to know what is causes it

## 2015-02-24 NOTE — Telephone Encounter (Signed)
Pt made aware and will take 3.125 twice daily and give [place] update Friday afternoon. Updated medication list. Will f/u with pt Friday

## 2015-02-24 NOTE — Progress Notes (Signed)
Anesthesia Chart Review: Patient is a 69 year old female scheduled for right L1 hemilaminectomy, foraminotomy on 03/01/15 by Dr. Lorin Mercy.  History includes former smoker, HTN, CAD, GERD, CVA '10, c-spine surgery. BMI is consistent with obesity.  PCP is Dr. Sharilyn Sites. Cardiologist is Dr. Carlyle Dolly.  He cleared her for surgery following a 02/02/15 stress test.   According to PAT RN notes, patient reported having dizzy spells for the past two weeks.  She was not dizzy today.  She thought her Coreg could be causing her dizziness, so she stopped in two days ago.  Orthostatics were negative today.  She left Dr. Harl Bowie a message this morning to see if he has any additional recommendations particularly with her upcoming surgery. He responded by asking her to decrease the dose by half.   Meds include ASA, Coreg 6.25mg , lisinopril-HCTZ, Claritin, pravastatin.      02/02/15 Nuclear stress test:  IMPRESSION: 1. No reversible ischemia or infarction. 2. Normal left ventricular wall motion. 3. Left ventricular ejection fraction 87% 4. Low-risk stress test findings*.  07/14/09 Echo: Left ventricle: The cavity size was normal. Systolic function was normal. The estimated ejection fraction was in the range of 55% to60%. Wall motion was normal; there were no regional wall motionabnormalities.  01/06/09 Cardiac cath: 1. Left main normal. 2. LAD normal. 3. Left circumflex normal. 4. Ramus intermedius branch which arose as a high marginal was moderately sized and had a 70% fairly focal proximal stenosis. 5. Right coronary was dominant normal. 6. Left ventriculography: The overall LVEF was estimated at greater than 60% without focal wall motion abnormalities. IMPRESSION: Ms. Abee has a moderate stenosis in the ramus branch. I do not think this is contributing to her symptoms. Continued medical therapy will be recommended.  01/27/15 EKG: SR, non-specific T wave abnormality.  Preoperative labs noted. K  3.3, Cr 0.72, glucose 119, AST 48, ALT 63 (not recent comparison LFTs), H/H 15.4/45.8. PT/PTT WNL. LFTs are less than two time above normal and PLT and PTT/PTT WNL, so I will not plan to repeat pre-operatively. She is on statin medication.  If no acute changes then I would anticipate that she could proceed as planned.  George Hugh [facility] Short Stay Center/Anesthesiology Phone [phone] 02/24/2015 3:09 PM

## 2015-02-24 NOTE — Telephone Encounter (Signed)
Coreg is a good medication for her heart, especially with an upcoming surgery. From notes she was taking 6.25mg  bid, can she try taking 3.125mg  bid instead for a few days and call us back   J branch MD

## 2015-02-24 NOTE — Progress Notes (Signed)
Mrs Kiss states she has been having dizzy spell for 2 weeks, once last week and  she became nauseous.  This week she has had it once with nausea, not dizzy today.  Patient reported that her husband read label of Coreg and it said may call dizziness.  I instructed patient to call cardiologist and inform him of dizziness and stopping  meduications.   Orthostatic VS checked,  Charted in flow sheets.

## 2015-02-26 ENCOUNTER — Telehealth: Payer: Self-pay | Admitting: *Deleted

## 2015-02-26 NOTE — Telephone Encounter (Signed)
F/u with pt w/dizziness and decrease in Coreg. Pt states she feels much better, denies any symptoms. Is scheduled for back surgery as planned on Monday. Will forward to Dr. Harl Bowie as Lisa Griffith

## 2015-02-28 MED ORDER — CEFAZOLIN SODIUM-DEXTROSE 2-3 GM-% IV SOLR
2.0000 g | INTRAVENOUS | Status: AC
  Administered 2015-03-01: 2 g via INTRAVENOUS
  Filled 2015-02-28: qty 50

## 2015-02-28 NOTE — H&P (Signed)
Lisa Griffith is an 69 y.o. female.    A 69 year old female returns with acute exacerbation of chronic back pain.  She has had injections in the past with only partial relief.  She has been to the emergency room due to her severe pain.  The pain is in her back and radiates down her right leg with right leg weakness.  She denies fevers, chills or associated bowel or bladder symptoms.  She has used oxycodone.  Is taking a prednisone pack.  Has been on Flexeril.  Last injection was 12/04/2014.  The pain is on the right and radiates into the right anterior thigh.  It does not go down past her knee.  Pain is worse with flexion.  States she is not able to handle the pain. Ultram is not effective.  She requested a Percocet prescription, which was given 1 p.o. every 4 hours p.r.n. pain.    RADIOGRAPHS:  MRI scan on 01/05/2015, demonstrated facet degeneration and disc bulge with severe right foraminal stenosis and mild central stenosis.  Some narrowing at the L2-3 level on the right, which is unchanged in comparison to her previous MRI.  Some moderate severe central narrowing at L3-4 with left greater than right lateral recess narrowing.   MEDICATIONS:  Medication history is reviewed.  She is on narcotic meds.  Has been on carvedilol, lisinopril, and mini-dose aspirin.     Past Medical History  Diagnosis Date  . Hypertension   . Coronary artery disease   . GERD (gastroesophageal reflux disease)   . Back muscle spasm   . Headache(784.0)   . Seasonal allergies   . Ruptured lumbar disc   . Stroke 2010    "mild"   . Constipation     Past Surgical History  Procedure Laterality Date  . Cataract extraction, bilateral  2008    [place]  . Posterior laminectomy / decompression cervical spine      [place]  . Colonoscopy  01/16/2012    Procedure: COLONOSCOPY;  Surgeon: Jamesetta So, MD;  Location: [facility];  Service: Gastroenterology;  Laterality: N/A;  . Colonoscopy    . Cardiac  catheterization  12/2008    [facility]  . Eye surgery Bilateral     cataract    Family History  Problem Relation Age of Onset  . Cancer Mother   . Cancer Father    Social History:  reports that she quit smoking about 18 years ago. Her smoking use included Cigarettes. She started smoking about 53 years ago. She has a 17.5 pack-year smoking history. She has never used smokeless tobacco. She reports that she does not drink alcohol or use illicit drugs.  Allergies:  Allergies  Allergen Reactions  . Hydrocodone Other (See Comments)    Hallucinations  . Statins     Pt unsure of reaction     No prescriptions prior to admission    No results found for this or any previous visit (from the past 48 hour(s)). No results found.  Review of Systems  Unable to perform ROS   There were no vitals taken for this visit. Physical Exam  PHYSICAL EXAMINATION:  She has hip flexion weakness on the right, none on the left.  No pain with hip range of motion.  Lungs are clear.  No supraclavicular lymphadenopathy.  No audible wheezes.  Heart is regular.  Palpable distal pulses.  Anterior tib and EHL are strong.   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  PLAN:  We discussed options with her.  Previous injections have not been effective and MRI scan showed further protrusion, consistent with her radiculopathy.  She states she would like to proceed with operative intervention.  She would need cardiac clearance first with her cardiologist, Dr. Gwenlyn Found.  Possible procedure discussed.  She can use the Percocet as needed for pain and we can see her in followup after review with her cardiologist, Dr. Gwenlyn Found.    OWENS,JAMES M 02/28/2015, 11:51 PM

## 2015-03-01 ENCOUNTER — Encounter (HOSPITAL_COMMUNITY)
Admission: RE | Disposition: A | Discharge status: Home / Self Care | Payer: Self-pay | Source: Ambulatory Visit | Attending: Orthopaedic Surgery

## 2015-03-01 ENCOUNTER — Ambulatory Visit (HOSPITAL_COMMUNITY): Payer: Medicare Other | Admitting: Certified Registered Nurse Anesthetist

## 2015-03-01 ENCOUNTER — Ambulatory Visit (HOSPITAL_COMMUNITY): Payer: Medicare Other

## 2015-03-01 ENCOUNTER — Observation Stay (HOSPITAL_COMMUNITY)
Admission: RE | Admit: 2015-03-01 | Discharge: 2015-03-02 | Disposition: A | Discharge status: Home / Self Care | Payer: Medicare Other | Source: Ambulatory Visit | Attending: Orthopaedic Surgery | Admitting: Orthopaedic Surgery

## 2015-03-01 ENCOUNTER — Encounter (HOSPITAL_COMMUNITY): Payer: Self-pay | Admitting: *Deleted

## 2015-03-01 ENCOUNTER — Ambulatory Visit (HOSPITAL_COMMUNITY): Payer: Medicare Other | Admitting: Vascular Surgery

## 2015-03-01 DIAGNOSIS — M5116 Intervertebral disc disorders with radiculopathy, lumbar region: Secondary | ICD-10-CM | POA: Diagnosis not present

## 2015-03-01 DIAGNOSIS — Z9841 Cataract extraction status, right eye: Secondary | ICD-10-CM | POA: Insufficient documentation

## 2015-03-01 DIAGNOSIS — Z87891 Personal history of nicotine dependence: Secondary | ICD-10-CM | POA: Diagnosis not present

## 2015-03-01 DIAGNOSIS — I252 Old myocardial infarction: Secondary | ICD-10-CM | POA: Insufficient documentation

## 2015-03-01 DIAGNOSIS — I2510 Atherosclerotic heart disease of native coronary artery without angina pectoris: Secondary | ICD-10-CM | POA: Insufficient documentation

## 2015-03-01 DIAGNOSIS — Z886 Allergy status to analgesic agent status: Secondary | ICD-10-CM | POA: Insufficient documentation

## 2015-03-01 DIAGNOSIS — Z9889 Other specified postprocedural states: Secondary | ICD-10-CM

## 2015-03-01 DIAGNOSIS — Z6832 Body mass index (BMI) 32.0-32.9, adult: Secondary | ICD-10-CM | POA: Insufficient documentation

## 2015-03-01 DIAGNOSIS — I509 Heart failure, unspecified: Secondary | ICD-10-CM | POA: Diagnosis not present

## 2015-03-01 DIAGNOSIS — M4806 Spinal stenosis, lumbar region: Secondary | ICD-10-CM | POA: Diagnosis not present

## 2015-03-01 DIAGNOSIS — Z8673 Personal history of transient ischemic attack (TIA), and cerebral infarction without residual deficits: Secondary | ICD-10-CM | POA: Insufficient documentation

## 2015-03-01 DIAGNOSIS — M545 Low back pain: Secondary | ICD-10-CM | POA: Diagnosis not present

## 2015-03-01 DIAGNOSIS — K219 Gastro-esophageal reflux disease without esophagitis: Secondary | ICD-10-CM | POA: Insufficient documentation

## 2015-03-01 DIAGNOSIS — I10 Essential (primary) hypertension: Secondary | ICD-10-CM | POA: Insufficient documentation

## 2015-03-01 DIAGNOSIS — M47817 Spondylosis without myelopathy or radiculopathy, lumbosacral region: Secondary | ICD-10-CM | POA: Diagnosis not present

## 2015-03-01 DIAGNOSIS — Z9842 Cataract extraction status, left eye: Secondary | ICD-10-CM | POA: Insufficient documentation

## 2015-03-01 DIAGNOSIS — M549 Dorsalgia, unspecified: Secondary | ICD-10-CM | POA: Diagnosis present

## 2015-03-01 DIAGNOSIS — Z419 Encounter for procedure for purposes other than remedying health state, unspecified: Secondary | ICD-10-CM

## 2015-03-01 HISTORY — PX: LUMBAR LAMINECTOMY/DECOMPRESSION MICRODISCECTOMY: SHX5026

## 2015-03-01 SURGERY — LUMBAR LAMINECTOMY/DECOMPRESSION MICRODISCECTOMY
Anesthesia: General | Site: Back | Laterality: Right

## 2015-03-01 MED ORDER — PROPOFOL 10 MG/ML IV BOLUS
INTRAVENOUS | Status: DC | PRN
  Administered 2015-03-01: 130 mg via INTRAVENOUS

## 2015-03-01 MED ORDER — SODIUM CHLORIDE 0.9 % IJ SOLN: 3.0000 mL | Freq: Two times a day (BID) | INTRAMUSCULAR | Status: DC

## 2015-03-01 MED ORDER — ROCURONIUM BROMIDE 50 MG/5ML IV SOLN
INTRAVENOUS | Status: AC
  Filled 2015-03-01: qty 1

## 2015-03-01 MED ORDER — SODIUM CHLORIDE 0.9 % IV SOLN: 250.0000 mL | INTRAVENOUS | Status: DC

## 2015-03-01 MED ORDER — HYDROMORPHONE HCL 1 MG/ML IJ SOLN: 0.2500 mg | INTRAMUSCULAR | Status: DC | PRN

## 2015-03-01 MED ORDER — METHOCARBAMOL 1000 MG/10ML IJ SOLN
500.0000 mg | Freq: Four times a day (QID) | INTRAMUSCULAR | Status: DC | PRN
  Filled 2015-03-01: qty 5

## 2015-03-01 MED ORDER — CHLORHEXIDINE GLUCONATE 4 % EX LIQD
60.0000 mL | Freq: Once | CUTANEOUS | Status: DC
  Filled 2015-03-01: qty 60

## 2015-03-01 MED ORDER — 0.9 % SODIUM CHLORIDE (POUR BTL) OPTIME
TOPICAL | Status: DC | PRN
  Administered 2015-03-01: 1000 mL

## 2015-03-01 MED ORDER — LACTATED RINGERS IV SOLN
INTRAVENOUS | Status: DC | PRN
  Administered 2015-03-01 (×2): via INTRAVENOUS

## 2015-03-01 MED ORDER — ACETAMINOPHEN 650 MG RE SUPP: 650.0000 mg | RECTAL | Status: DC | PRN

## 2015-03-01 MED ORDER — ACETAMINOPHEN 325 MG PO TABS: 325.0000 mg | ORAL_TABLET | ORAL | Status: DC | PRN

## 2015-03-01 MED ORDER — ACETAMINOPHEN 325 MG PO TABS
650.0000 mg | ORAL_TABLET | ORAL | Status: DC | PRN
  Administered 2015-03-01 – 2015-03-02 (×3): 650 mg via ORAL
  Filled 2015-03-01 (×3): qty 2

## 2015-03-01 MED ORDER — PHENYLEPHRINE HCL 10 MG/ML IJ SOLN
INTRAMUSCULAR | Status: DC | PRN
  Administered 2015-03-01 (×4): 80 ug via INTRAVENOUS

## 2015-03-01 MED ORDER — OXYCODONE HCL 5 MG/5ML PO SOLN: 5.0000 mg | Freq: Once | ORAL | Status: AC | PRN

## 2015-03-01 MED ORDER — CARVEDILOL 3.125 MG PO TABS
3.1250 mg | ORAL_TABLET | Freq: Two times a day (BID) | ORAL | Status: DC
  Administered 2015-03-01 – 2015-03-02 (×2): 3.125 mg via ORAL
  Filled 2015-03-01 (×2): qty 1

## 2015-03-01 MED ORDER — METHOCARBAMOL 500 MG PO TABS: 500.0000 mg | ORAL_TABLET | Freq: Four times a day (QID) | ORAL | Status: DC

## 2015-03-01 MED ORDER — SODIUM CHLORIDE 0.9 % IJ SOLN
3.0000 mL | INTRAMUSCULAR | Status: DC | PRN
  Administered 2015-03-02: 3 mL via INTRAVENOUS
  Filled 2015-03-01: qty 3

## 2015-03-01 MED ORDER — OXYCODONE-ACETAMINOPHEN 5-325 MG PO TABS: 1.0000 | ORAL_TABLET | Freq: Four times a day (QID) | ORAL | Status: DC | PRN

## 2015-03-01 MED ORDER — ONDANSETRON HCL 4 MG/2ML IJ SOLN
INTRAMUSCULAR | Status: AC
  Filled 2015-03-01: qty 2

## 2015-03-01 MED ORDER — LISINOPRIL 20 MG PO TABS
20.0000 mg | ORAL_TABLET | Freq: Every day | ORAL | Status: DC
  Administered 2015-03-02: 20 mg via ORAL
  Filled 2015-03-01: qty 1

## 2015-03-01 MED ORDER — HYDROCHLOROTHIAZIDE 25 MG PO TABS
25.0000 mg | ORAL_TABLET | Freq: Every day | ORAL | Status: DC
  Administered 2015-03-02: 25 mg via ORAL
  Filled 2015-03-01: qty 1

## 2015-03-01 MED ORDER — METHOCARBAMOL 500 MG PO TABS: 500.0000 mg | ORAL_TABLET | Freq: Four times a day (QID) | ORAL | Status: DC | PRN

## 2015-03-01 MED ORDER — MIDAZOLAM HCL 2 MG/2ML IJ SOLN
INTRAMUSCULAR | Status: AC
  Filled 2015-03-01: qty 2

## 2015-03-01 MED ORDER — CEFAZOLIN SODIUM-DEXTROSE 2-3 GM-% IV SOLR
INTRAVENOUS | Status: DC | PRN
  Administered 2015-03-01: 2 g via INTRAVENOUS

## 2015-03-01 MED ORDER — DOCUSATE SODIUM 100 MG PO CAPS
100.0000 mg | ORAL_CAPSULE | Freq: Two times a day (BID) | ORAL | Status: DC
  Administered 2015-03-01 – 2015-03-02 (×2): 100 mg via ORAL
  Filled 2015-03-01 (×2): qty 1

## 2015-03-01 MED ORDER — OXYCODONE HCL 5 MG PO TABS
ORAL_TABLET | ORAL | Status: AC
  Filled 2015-03-01: qty 1

## 2015-03-01 MED ORDER — NEOSTIGMINE METHYLSULFATE 10 MG/10ML IV SOLN
INTRAVENOUS | Status: DC | PRN
  Administered 2015-03-01: 4 mg via INTRAVENOUS

## 2015-03-01 MED ORDER — LIDOCAINE HCL (CARDIAC) 20 MG/ML IV SOLN
INTRAVENOUS | Status: AC
  Filled 2015-03-01: qty 5

## 2015-03-01 MED ORDER — FENTANYL CITRATE 0.05 MG/ML IJ SOLN
INTRAMUSCULAR | Status: DC | PRN
  Administered 2015-03-01: 100 ug via INTRAVENOUS
  Administered 2015-03-01: 50 ug via INTRAVENOUS

## 2015-03-01 MED ORDER — HYDROMORPHONE HCL 1 MG/ML IJ SOLN: 0.5000 mg | INTRAMUSCULAR | Status: DC | PRN

## 2015-03-01 MED ORDER — EPHEDRINE SULFATE 50 MG/ML IJ SOLN
INTRAMUSCULAR | Status: DC | PRN
  Administered 2015-03-01 (×2): 5 mg via INTRAVENOUS

## 2015-03-01 MED ORDER — MIDAZOLAM HCL 5 MG/5ML IJ SOLN
INTRAMUSCULAR | Status: DC | PRN
  Administered 2015-03-01: 2 mg via INTRAVENOUS

## 2015-03-01 MED ORDER — SENNOSIDES-DOCUSATE SODIUM 8.6-50 MG PO TABS: 1.0000 | ORAL_TABLET | Freq: Every evening | ORAL | Status: DC | PRN

## 2015-03-01 MED ORDER — BUPIVACAINE HCL (PF) 0.25 % IJ SOLN
INTRAMUSCULAR | Status: DC | PRN
  Administered 2015-03-01: 10 mL

## 2015-03-01 MED ORDER — GLYCOPYRROLATE 0.2 MG/ML IJ SOLN
INTRAMUSCULAR | Status: DC | PRN
  Administered 2015-03-01: .6 mg via INTRAVENOUS

## 2015-03-01 MED ORDER — MENTHOL 3 MG MT LOZG: 1.0000 | LOZENGE | OROMUCOSAL | Status: DC | PRN

## 2015-03-01 MED ORDER — PRAVASTATIN SODIUM 20 MG PO TABS
20.0000 mg | ORAL_TABLET | Freq: Every day | ORAL | Status: DC
Start: 2015-03-01 — End: 2015-03-02
  Administered 2015-03-01: 20 mg via ORAL
  Filled 2015-03-01: qty 1

## 2015-03-01 MED ORDER — BUPIVACAINE HCL (PF) 0.25 % IJ SOLN
INTRAMUSCULAR | Status: AC
  Filled 2015-03-01: qty 30

## 2015-03-01 MED ORDER — CEFAZOLIN SODIUM 1-5 GM-% IV SOLN
1.0000 g | Freq: Three times a day (TID) | INTRAVENOUS | Status: AC
  Administered 2015-03-01 – 2015-03-02 (×2): 1 g via INTRAVENOUS
  Filled 2015-03-01 (×2): qty 50

## 2015-03-01 MED ORDER — LIDOCAINE HCL (CARDIAC) 20 MG/ML IV SOLN
INTRAVENOUS | Status: DC | PRN
  Administered 2015-03-01: 70 mg via INTRAVENOUS

## 2015-03-01 MED ORDER — POTASSIUM CHLORIDE IN NACL 20-0.45 MEQ/L-% IV SOLN
INTRAVENOUS | Status: DC
  Administered 2015-03-01: 20:00:00 via INTRAVENOUS
  Filled 2015-03-01 (×3): qty 1000

## 2015-03-01 MED ORDER — ACETAMINOPHEN 160 MG/5ML PO SOLN
325.0000 mg | ORAL | Status: DC | PRN
  Filled 2015-03-01: qty 20.3

## 2015-03-01 MED ORDER — PHENOL 1.4 % MT LIQD: 1.0000 | OROMUCOSAL | Status: DC | PRN

## 2015-03-01 MED ORDER — LISINOPRIL-HYDROCHLOROTHIAZIDE 20-25 MG PO TABS: 1.0000 | ORAL_TABLET | Freq: Every day | ORAL | Status: DC

## 2015-03-01 MED ORDER — ONDANSETRON HCL 4 MG/2ML IJ SOLN
INTRAMUSCULAR | Status: DC | PRN
  Administered 2015-03-01: 4 mg via INTRAVENOUS

## 2015-03-01 MED ORDER — ONDANSETRON HCL 4 MG/2ML IJ SOLN: 4.0000 mg | INTRAMUSCULAR | Status: DC | PRN

## 2015-03-01 MED ORDER — LACTATED RINGERS IV SOLN
INTRAVENOUS | Status: DC
  Administered 2015-03-01: 11:00:00 via INTRAVENOUS

## 2015-03-01 MED ORDER — OXYCODONE HCL 5 MG PO TABS
5.0000 mg | ORAL_TABLET | Freq: Once | ORAL | Status: AC | PRN
  Administered 2015-03-01: 5 mg via ORAL

## 2015-03-01 MED ORDER — ROCURONIUM BROMIDE 100 MG/10ML IV SOLN
INTRAVENOUS | Status: DC | PRN
  Administered 2015-03-01: 40 mg via INTRAVENOUS

## 2015-03-01 MED ORDER — LORATADINE 10 MG PO TABS: 10.0000 mg | ORAL_TABLET | Freq: Every day | ORAL | Status: DC | PRN

## 2015-03-01 MED ORDER — FENTANYL CITRATE 0.05 MG/ML IJ SOLN
INTRAMUSCULAR | Status: AC
Start: 2015-03-01 — End: 2015-03-01
  Filled 2015-03-01: qty 5

## 2015-03-01 MED ORDER — PHENYLEPHRINE HCL 10 MG/ML IJ SOLN
10.0000 mg | INTRAMUSCULAR | Status: DC | PRN
  Administered 2015-03-01: 15 ug/min via INTRAVENOUS

## 2015-03-01 SURGICAL SUPPLY — 42 items
ADH SKN CLS APL DERMABOND .7 (GAUZE/BANDAGES/DRESSINGS) ×1
BUR ROUND FLUTED 4 SOFT TCH (BURR) IMPLANT
BUR ROUND FLUTED 4MM SOFT TCH (BURR)
COVER SURGICAL LIGHT HANDLE (MISCELLANEOUS) ×3 IMPLANT
DERMABOND ADVANCED (GAUZE/BANDAGES/DRESSINGS) ×2
DERMABOND ADVANCED .7 DNX12 (GAUZE/BANDAGES/DRESSINGS) ×1 IMPLANT
DRAPE MICROSCOPE LEICA (MISCELLANEOUS) ×3 IMPLANT
DRAPE PROXIMA HALF (DRAPES) ×6 IMPLANT
DRSG MEPILEX BORDER 4X4 (GAUZE/BANDAGES/DRESSINGS) ×3 IMPLANT
DRSG MEPILEX BORDER 4X8 (GAUZE/BANDAGES/DRESSINGS) ×3 IMPLANT
DURAPREP 26ML APPLICATOR (WOUND CARE) ×3 IMPLANT
ELECT REM PT RETURN 9FT ADLT (ELECTROSURGICAL) ×3
ELECTRODE REM PT RTRN 9FT ADLT (ELECTROSURGICAL) ×1 IMPLANT
GLOVE BIOGEL PI IND STRL 8 (GLOVE) ×2 IMPLANT
GLOVE BIOGEL PI INDICATOR 8 (GLOVE) ×4
GLOVE ORTHO TXT STRL SZ7.5 (GLOVE) ×6 IMPLANT
GOWN STRL REUS W/ TWL LRG LVL3 (GOWN DISPOSABLE) ×2 IMPLANT
GOWN STRL REUS W/ TWL XL LVL3 (GOWN DISPOSABLE) ×1 IMPLANT
GOWN STRL REUS W/TWL 2XL LVL3 (GOWN DISPOSABLE) ×3 IMPLANT
GOWN STRL REUS W/TWL LRG LVL3 (GOWN DISPOSABLE) ×6
GOWN STRL REUS W/TWL XL LVL3 (GOWN DISPOSABLE) ×3
KIT BASIN OR (CUSTOM PROCEDURE TRAY) ×3 IMPLANT
KIT ROOM TURNOVER OR (KITS) ×3 IMPLANT
MANIFOLD NEPTUNE II (INSTRUMENTS) ×3 IMPLANT
NDL HYPO 25GX1X1/2 BEV (NEEDLE) ×1 IMPLANT
NDL SPNL 18GX3.5 QUINCKE PK (NEEDLE) ×1 IMPLANT
NEEDLE HYPO 25GX1X1/2 BEV (NEEDLE) ×3 IMPLANT
NEEDLE SPNL 18GX3.5 QUINCKE PK (NEEDLE) ×3 IMPLANT
NS IRRIG 1000ML POUR BTL (IV SOLUTION) ×3 IMPLANT
PACK LAMINECTOMY ORTHO (CUSTOM PROCEDURE TRAY) ×3 IMPLANT
PAD ARMBOARD 7.5X6 YLW CONV (MISCELLANEOUS) ×6 IMPLANT
PATTIES SURGICAL .5 X.5 (GAUZE/BANDAGES/DRESSINGS) IMPLANT
PATTIES SURGICAL .75X.75 (GAUZE/BANDAGES/DRESSINGS) IMPLANT
SUT BONE WAX W31G (SUTURE) IMPLANT
SUT VIC AB 0 CT1 27 (SUTURE) ×3
SUT VIC AB 0 CT1 27XBRD ANBCTR (SUTURE) ×1 IMPLANT
SUT VIC AB 2-0 CT1 27 (SUTURE) ×3
SUT VIC AB 2-0 CT1 TAPERPNT 27 (SUTURE) ×1 IMPLANT
SUT VIC AB 3-0 X1 27 (SUTURE) IMPLANT
TOWEL OR 17X24 6PK STRL BLUE (TOWEL DISPOSABLE) ×3 IMPLANT
TOWEL OR 17X26 10 PK STRL BLUE (TOWEL DISPOSABLE) ×3 IMPLANT
WATER STERILE IRR 1000ML POUR (IV SOLUTION) ×3 IMPLANT

## 2015-03-01 NOTE — Anesthesia Preprocedure Evaluation (Addendum)
Anesthesia Evaluation  Patient identified by MRN, date of birth, ID band Patient awake    Reviewed: Allergy & Precautions, NPO status , Patient's Chart, lab work & pertinent test results, reviewed documented beta blocker date and time   History of Anesthesia Complications Negative for: history of anesthetic complications  Airway Mallampati: II  TM Distance: >3 FB Neck ROM: Full    Dental  (+) Teeth Intact   Pulmonary former smoker,          Cardiovascular hypertension, Pt. on home beta blockers and Pt. on medications - angina+ CAD - Past MI and - CHF - dysrhythmias Rhythm:Regular     Neuro/Psych  Headaches, TIA Neuromuscular disease CVA, No Residual Symptoms negative psych ROS   GI/Hepatic Neg liver ROS, GERD-  Medicated and Controlled,  Endo/Other  Morbid obesity  Renal/GU negative Renal ROS     Musculoskeletal   Abdominal (+)  Abdomen: soft.    Peds  Hematology negative hematology ROS (+)   Anesthesia Other Findings   Reproductive/Obstetrics                            Anesthesia Physical Anesthesia Plan  ASA: III  Anesthesia Plan: General   Post-op Pain Management:    Induction: Intravenous  Airway Management Planned: Oral ETT  Additional Equipment: None  Intra-op Plan:   Post-operative Plan: Extubation in OR  Informed Consent: I have reviewed the patients History and Physical, chart, labs and discussed the procedure including the risks, benefits and alternatives for the proposed anesthesia with the patient or authorized representative who has indicated his/her understanding and acceptance.   Dental advisory given  Plan Discussed with: Surgeon and CRNA  Anesthesia Plan Comments:        Anesthesia Quick Evaluation

## 2015-03-01 NOTE — Transfer of Care (Signed)
Immediate Anesthesia Transfer of Care Note  Patient: Lisa Griffith  Procedure(s) Performed: Procedure(s): Right L1 Hemilaminectomy, Foraminotomy (Right)  Patient Location: PACU  Anesthesia Type:General  Level of Consciousness: awake, alert  and oriented  Airway & Oxygen Therapy: Patient Spontanous Breathing and Patient connected to face mask oxygen  Post-op Assessment: Report given to RN and Post -op Vital signs reviewed and stable  Post vital signs: Reviewed and stable  Last Vitals:  Filed Vitals:   03/01/15 1034  BP: 133/66  Pulse: 75  Temp: 36.8 C  Resp: 20    Complications: No apparent anesthesia complications

## 2015-03-01 NOTE — Anesthesia Postprocedure Evaluation (Signed)
Anesthesia Post Note  Patient: Lisa Griffith  Procedure(s) Performed: Procedure(s) (LRB): Right L1 Hemilaminectomy, Foraminotomy (Right)  Anesthesia type: General  Patient location: PACU  Post pain: Pain level controlled and Adequate analgesia  Post assessment: Post-op Vital signs reviewed, Patient's Cardiovascular Status Stable, Respiratory Function Stable, Patent Airway and Pain level controlled  Last Vitals:  Filed Vitals:   03/01/15 1604  BP:   Pulse: 72  Temp:   Resp: 16    Post vital signs: Reviewed and stable  Level of consciousness: awake, alert  and oriented  Complications: No apparent anesthesia complications

## 2015-03-01 NOTE — Interval H&P Note (Signed)
History and Physical Interval Note:  03/01/2015 1:33 PM  Lisa Griffith  has presented today for surgery, with the diagnosis of Right L1-2 Foraminal Stenosis, Lateral Recess Stenosis  The various methods of treatment have been discussed with the patient and family. After consideration of risks, benefits and other options for treatment, the patient has consented to  Procedure(s): Right L1 Hemilaminectomy, Foraminotomy (Right) as a surgical intervention .  The patient's history has been reviewed, patient examined, no change in status, stable for surgery.  I have reviewed the patient's chart and labs.  Questions were answered to the patient's satisfaction.     [name] C

## 2015-03-01 NOTE — Anesthesia Procedure Notes (Signed)
Procedure Name: Intubation Date/Time: 03/01/2015 1:48 PM Performed by: Clearnce Sorrel Pre-anesthesia Checklist: Patient identified, Timeout performed, Emergency Drugs available, Suction available and Patient being monitored Patient Re-evaluated:Patient Re-evaluated prior to inductionOxygen Delivery Method: Circle system utilized Preoxygenation: Pre-oxygenation with 100% oxygen Intubation Type: IV induction Ventilation: Mask ventilation without difficulty Laryngoscope Size: Mac and 3 Grade View: Grade I Tube type: Oral Tube size: 7.5 mm Number of attempts: 1 Placement Confirmation: ETT inserted through vocal cords under direct vision,  breath sounds checked- equal and bilateral and positive ETCO2 Secured at: 22 cm Tube secured with: Tape Dental Injury: Teeth and Oropharynx as per pre-operative assessment

## 2015-03-01 NOTE — Brief Op Note (Signed)
03/01/2015  4:00 PM  PATIENT:  Lisa Griffith  69 y.o. female  PRE-OPERATIVE DIAGNOSIS:  Right L1-2 Foraminal Stenosis, Lateral Recess Stenosis  POST-OPERATIVE DIAGNOSIS:  Right L1-2 Foraminal Stenosis, Lateral Recess Stenosis  PROCEDURE:  Procedure(s): Right L1 Hemilaminectomy, Foraminotomy (Right)  SURGEON:  Surgeon(s) and Role:    * Marybelle Killings, MD - Primary  PHYSICIAN ASSISTANT: Benjiman Core pa-c  ANESTHESIA:   general  EBL:  Total I/O In: 1000 [I.V.:1000] Out: -   BLOOD ADMINISTERED:none  DRAINS: none   LOCAL MEDICATIONS USED:  MARCAINE     SPECIMEN:  No Specimen  DISPOSITION OF SPECIMEN:  N/A  COUNTS:  YES  TOURNIQUET:  * No tourniquets in log *  PATIENT DISPOSITION:  PACU - hemodynamically stable.

## 2015-03-02 ENCOUNTER — Encounter (HOSPITAL_COMMUNITY): Payer: Self-pay | Admitting: Orthopaedic Surgery

## 2015-03-02 DIAGNOSIS — I2510 Atherosclerotic heart disease of native coronary artery without angina pectoris: Secondary | ICD-10-CM | POA: Diagnosis not present

## 2015-03-02 DIAGNOSIS — M4806 Spinal stenosis, lumbar region: Secondary | ICD-10-CM | POA: Diagnosis not present

## 2015-03-02 DIAGNOSIS — I10 Essential (primary) hypertension: Secondary | ICD-10-CM | POA: Diagnosis not present

## 2015-03-02 DIAGNOSIS — M5116 Intervertebral disc disorders with radiculopathy, lumbar region: Secondary | ICD-10-CM | POA: Diagnosis not present

## 2015-03-02 NOTE — Progress Notes (Signed)
Lisa Griffith discharged home per MD order. Discharge instructions reviewed and discussed with patient. All questions and concerns answered. Copy of instructions and scripts given to patient. IV removed.  Patient escorted to car by staff in a wheelchair. No distress noted upon discharge.   [name] R 03/02/2015 12:02 PM

## 2015-03-02 NOTE — Op Note (Signed)
NAME:  JETTY, BERLAND            ACCOUNT NO.:  [id number]  MEDICAL RECORD NO.:  [id number]  LOCATION:                                 FACILITY:  PHYSICIAN:  Andrej Spagnoli C. Lorin Mercy, M.D.    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  PROCEDURE:  Right L1-L2 microdiskectomy, lateral recess decompression, removal of herniated nucleus pulposus, and microdiskectomy.  SURGEON:  Yasmeen Manka C. Lorin Mercy, M.D.  ASSISTANT:  Benjiman Core PA-C, medically necessary and present for the entire procedure.  EBL:  Minimal.  FINDINGS:  Multifactorial lateral recess compression with  hypertrophic ligament, disk herniation, free fragment in the lateral recess, and now up to the foramina with a  bulging right L1-L2 herniated nucleus pulposus.  DESCRIPTION OF PROCEDURE:  After induction of anesthesia, the patient was placed prone, needle localization with spinal needle based on palpation.  Landmarks after DuraPrep.  Area was squared with towels, Betadine, Steri-Drape, and laminectomy sheet and drape.  The needle was extending caudally,  subperiosteal dissection on the lamina on the right.  Short Taylor retractor placed laterally.  Lamina was thinned and Penfield 4 was placed down over the interlaminar space, which should be just below the disk space confirmed with a second x-ray.  Operative microscope was draped, brought in, and remaining portion of the lamina was removed up to the level of the disk, leaving the top 1/3rd of the lamina.  The burr was used to remove  a portion of the facet out to the level of the pedicle and there were very large thick chunks of ligament which were removed.  Cephalad using a ball-tip nerve probe, piece of disk was the  visualized, pulled, and a large fragment was removed, which was out the foramina, hooking around the pedicle, compressing the nerve root, and extending cephalad above the disk space.  Fragment was removed.  Annulus was incised.  Passes were made with micropituitary. Regular pituitary were not fit into the space, up and down micropituitary were used.  A little bit of the top portion of L2 was removed with a laminotomy for exposure, nerve root was free, no lateral compression, and lateral recess was well decompressed.  Foramina was then carefully palpated, some remaining fragments of disk were removed until the hockey stick could be passed out the foramina without areas of compression.  Operative field was irrigated.  Fascia closed with #1 Vicryl, 2-0 Vicryl subcutaneous tissue, subcuticular skin closure, Dermabond on the skin, postop dressing, and transferred to recovery room.  Instrument count and needle count was correct.     Abie Killian C. Lorin Mercy, M.D.     MCY/MEDQ  D:  03/01/2015  T:  03/02/2015  Job:  [id number]

## 2015-03-02 NOTE — Discharge Instructions (Signed)
OK to shower. Walk daily avoid bending, lifting. See Dr Lorin Mercy in one week

## 2015-03-02 NOTE — Progress Notes (Signed)
Subjective: 1 Day Post-Op Procedure(s) (LRB): Right L1 Hemilaminectomy, Foraminotomy (Right) Patient reports pain as 1 on 0-10 scale.  No leg pain. Taking only tylenol.  Objective: Vital signs in last 24 hours: Temp:  [97.7 F (36.5 C)-98.5 F (36.9 C)] 98.5 F (36.9 C) (04/05 0453) Pulse Rate:  [68-85] 72 (04/05 0453) Resp:  [12-20] 16 (04/05 0453) BP: (91-135)/(46-66) 109/49 mmHg (04/05 0453) SpO2:  [90 %-99 %] 96 % (04/05 0453) Weight:  [81.647 kg (180 lb)] 81.647 kg (180 lb) (04/04 1032)  Intake/Output from previous day: 04/04 0701 - 04/05 0700 In: 2035 [I.V.:2035] Out: 450 [Urine:450] Intake/Output this shift:    No results for input(s): HGB in the last 72 hours. No results for input(s): WBC, RBC, HCT, PLT in the last 72 hours. No results for input(s): NA, K, CL, CO2, BUN, CREATININE, GLUCOSE, CALCIUM in the last 72 hours. No results for input(s): LABPT, INR in the last 72 hours.  Neurologically intact  Assessment/Plan: 1 Day Post-Op Procedure(s) (LRB): Right L1 Hemilaminectomy, Foraminotomy (Right) Plan: discharge home. Office one week  Aanshi Batchelder C 03/02/2015, 7:59 AM

## 2015-03-02 NOTE — Progress Notes (Signed)
UR completed 

## 2015-03-04 NOTE — Anesthesia Postprocedure Evaluation (Signed)
  Anesthesia Post-op Note  Patient: Lisa Griffith  Procedure(s) Performed: Procedure(s): Right L1 Hemilaminectomy, Foraminotomy (Right)  Patient Location: PACU  Anesthesia Type:General  Level of Consciousness: awake  Airway and Oxygen Therapy: Patient Spontanous Breathing and Patient connected to nasal cannula oxygen  Post-op Pain: mild  Post-op Assessment: Post-op Vital signs reviewed, Patient's Cardiovascular Status Stable, Respiratory Function Stable, Patent Airway, No signs of Nausea or vomiting and Pain level controlled  Post-op Vital Signs: Reviewed and stable  Last Vitals:  Filed Vitals:   03/02/15 1120  BP: 134/55  Pulse: 83  Temp: 36.7 C  Resp: 16    Complications: No apparent anesthesia complications

## 2015-03-11 NOTE — Discharge Summary (Signed)
Patient ID: Lisa Griffith MRN: [id number] DOB/AGE: October 12, 1946 69 y.o.  Admit date: 03/01/2015 Discharge date: 03/11/2015  Admission Diagnoses:  Active Problems:   S/P lumbar discectomy   Discharge Diagnoses:  Active Problems:   S/P lumbar discectomy  status post Procedure(s): Right L1 Hemilaminectomy, Foraminotomy  Past Medical History  Diagnosis Date  . Hypertension   . Coronary artery disease   . GERD (gastroesophageal reflux disease)   . Back muscle spasm   . Headache(784.0)   . Seasonal allergies   . Ruptured lumbar disc   . Stroke 2010    "mild"   . Constipation     Surgeries: Procedure(s): Right L1 Hemilaminectomy, Foraminotomy on 03/01/2015   Consultants:    Discharged Condition: Improved  Hospital Course: Lisa Griffith is an 69 y.o. female who was admitted 03/01/2015 for operative treatment of lumbar stenosis  Patient failed conservative treatments (please see the history and physical for the specifics) and had severe unremitting pain that affects sleep, daily activities and work/hobbies. After pre-op clearance, the patient was taken to the operating room on 03/01/2015 and underwent  Procedure(s): Right L1 Hemilaminectomy, Foraminotomy.    Patient was given perioperative antibiotics:  Anti-infectives    Start     Dose/Rate Route Frequency Ordered Stop   03/01/15 2000  ceFAZolin (ANCEF) IVPB 1 g/50 mL premix     1 g 100 mL/hr over 30 Minutes Intravenous Every 8 hours 03/01/15 1654 03/02/15 0506   03/01/15 0600  ceFAZolin (ANCEF) IVPB 2 g/50 mL premix     2 g 100 mL/hr over 30 Minutes Intravenous On call to O.R. 02/28/15 1506 03/01/15 1346       Patient was given sequential compression devices and early ambulation to prevent DVT.   Patient benefited maximally from hospital stay and there were no complications. At the time of discharge, the patient was urinating/moving their bowels without difficulty, tolerating a regular diet, pain is controlled  with oral pain medications and they have been cleared by PT/OT.   Recent vital signs: No data found.    Recent laboratory studies: No results for input(s): WBC, HGB, HCT, PLT, NA, K, CL, CO2, BUN, CREATININE, GLUCOSE, INR, CALCIUM in the last 72 hours.  Invalid input(s): PT, 2   Discharge Medications:     Medication List    STOP taking these medications        aspirin EC 81 MG tablet      TAKE these medications        carvedilol 3.125 MG tablet  Commonly known as:  COREG  Take 3.125 mg by mouth 2 (two) times daily with a meal.     lisinopril-hydrochlorothiazide 20-25 MG per tablet  Commonly known as:  PRINZIDE,ZESTORETIC  Take 1 tablet by mouth daily.     loratadine 10 MG tablet  Commonly known as:  CLARITIN  Take 10 mg by mouth daily as needed for allergies.     oxyCODONE-acetaminophen 5-325 MG per tablet  Commonly known as:  PERCOCET  Take 1 tablet by mouth every 6 (six) hours as needed.     pravastatin 20 MG tablet  Commonly known as:  PRAVACHOL  Take 20 mg by mouth daily.        Diagnostic Studies: Dg Lumbar Spine 2-3 Views  03/01/2015   CLINICAL DATA:  Intraoperative localization for spine surgery.  EXAM: LUMBAR SPINE - 2-3 VIEW  COMPARISON:  MRI 01/05/2015  FINDINGS: There is a spinal needle marking the L1 level on the  first image.  The second image demonstrates a surgical retractor and instrument marking the L1-2 level.  The third image demonstrates a surgical instrument marking L1-2.  IMPRESSION: L1-2 marked intraoperatively.   Electronically Signed   By: Marijo Sanes M.D.   On: 03/01/2015 18:12        Discharge Instructions    Call MD / Call 911    Complete by:  As directed   If you experience chest pain or shortness of breath, CALL 911 and be transported to the hospital emergency room.  If you develope a fever above 101 F, pus (white drainage) or increased drainage or redness at the wound, or calf pain, call your surgeon's office.     Constipation  Prevention    Complete by:  As directed   Drink plenty of fluids.  Prune juice may be helpful.  You may use a stool softener, such as Colace (over the counter) 100 mg twice a day.  Use MiraLax (over the counter) for constipation as needed.     Diet - low sodium heart healthy    Complete by:  As directed      Discharge instructions    Complete by:  As directed   Ok to shower 5 days postop.  Do not apply any creams or ointments to incision.  Do not remove steri-strips.  Can use 4x4 gauze and tape for dressing changes.  No aggressive activity.  No bending, squatting or prolonged sitting.  Mostly be in reclined position or lying down.     Driving restrictions    Complete by:  As directed   No driving until further notice.     Increase activity slowly as tolerated    Complete by:  As directed      Lifting restrictions    Complete by:  As directed   No lifting until further notice.           Follow-up Information    Follow up with Marybelle Killings, MD.   Specialty:  Orthopedic Surgery   Why:  need return office visit one week postop   Contact information:   [place] [address] [phone]       Discharge Plan:  discharge to home  Disposition:     Signed: Lanae Crumbly 03/11/2015, 9:21 AM

## 2015-06-11 ENCOUNTER — Other Ambulatory Visit (HOSPITAL_COMMUNITY): Payer: Self-pay | Admitting: Family Medicine

## 2015-06-11 DIAGNOSIS — Z1389 Encounter for screening for other disorder: Secondary | ICD-10-CM | POA: Diagnosis not present

## 2015-06-11 DIAGNOSIS — Z0000 Encounter for general adult medical examination without abnormal findings: Secondary | ICD-10-CM | POA: Diagnosis not present

## 2015-06-11 DIAGNOSIS — Z1231 Encounter for screening mammogram for malignant neoplasm of breast: Secondary | ICD-10-CM

## 2015-06-11 DIAGNOSIS — I10 Essential (primary) hypertension: Secondary | ICD-10-CM | POA: Diagnosis not present

## 2015-06-11 DIAGNOSIS — E782 Mixed hyperlipidemia: Secondary | ICD-10-CM | POA: Diagnosis not present

## 2015-06-21 ENCOUNTER — Ambulatory Visit (HOSPITAL_COMMUNITY)
Admission: RE | Admit: 2015-06-21 | Discharge: 2015-06-21 | Disposition: A | Discharge status: Home / Self Care | Payer: Medicare Other | Source: Ambulatory Visit | Attending: Family Medicine | Admitting: Family Medicine

## 2015-06-21 DIAGNOSIS — Z1231 Encounter for screening mammogram for malignant neoplasm of breast: Secondary | ICD-10-CM | POA: Insufficient documentation

## 2015-06-21 DIAGNOSIS — N390 Urinary tract infection, site not specified: Secondary | ICD-10-CM | POA: Diagnosis not present

## 2015-06-30 ENCOUNTER — Ambulatory Visit: Payer: Medicare Other | Admitting: Cardiology

## 2015-07-07 ENCOUNTER — Ambulatory Visit (INDEPENDENT_AMBULATORY_CARE_PROVIDER_SITE_OTHER): Payer: Medicare Other | Admitting: Cardiology

## 2015-07-07 ENCOUNTER — Encounter: Payer: Self-pay | Admitting: Cardiology

## 2015-07-07 VITALS — BP 121/75 | HR 71 | Ht 62.0 in | Wt 186.0 lb

## 2015-07-07 DIAGNOSIS — I2510 Atherosclerotic heart disease of native coronary artery without angina pectoris: Secondary | ICD-10-CM

## 2015-07-07 DIAGNOSIS — I10 Essential (primary) hypertension: Secondary | ICD-10-CM | POA: Diagnosis not present

## 2015-07-07 DIAGNOSIS — E785 Hyperlipidemia, unspecified: Secondary | ICD-10-CM

## 2015-07-07 NOTE — Progress Notes (Signed)
Patient ID: Lisa Griffith, female   DOB: Dec 26, 1945, 69 y.o.   MRN: [id number]     Clinical Summary Lisa Griffith is a 68 y.o.female seen today for follow up of the following medical problems.   1. CAD - cath 2010 with 70% ramus stenosis, otherwise patent vessels. LVEF 65% - 01/2015 Lexiscan MPI: no ischemia  - denies any recent chest pain. No SOB or DOE - compliant with meds   2. HTN - compliant with meds. Significant dizziness on higher doses of coreg.   3 . HL - lipitor caused muscle aches. Tolerates pravastatin well.    Past Medical History  Diagnosis Date  . Hypertension   . Coronary artery disease   . GERD (gastroesophageal reflux disease)   . Back muscle spasm   . Headache(784.0)   . Seasonal allergies   . Ruptured lumbar disc   . Stroke 2010    "mild"   . Constipation      Allergies  Allergen Reactions  . Hydrocodone Other (See Comments)    Hallucinations  . Statins     Pt unsure of reaction      Current Outpatient Prescriptions  Medication Sig Dispense Refill  . carvedilol (COREG) 3.125 MG tablet Take 3.125 mg by mouth 2 (two) times daily with a meal.    . lisinopril-hydrochlorothiazide (PRINZIDE,ZESTORETIC) 20-25 MG per tablet Take 1 tablet by mouth daily.    Marland Kitchen loratadine (CLARITIN) 10 MG tablet Take 10 mg by mouth daily as needed for allergies.    Marland Kitchen oxyCODONE-acetaminophen (PERCOCET) 5-325 MG per tablet Take 1 tablet by mouth every 6 (six) hours as needed. 60 tablet 0  . pravastatin (PRAVACHOL) 20 MG tablet Take 20 mg by mouth daily.     No current facility-administered medications for this visit.     Past Surgical History  Procedure Laterality Date  . Cataract extraction, bilateral  2008    [facility]  . Posterior laminectomy / decompression cervical spine      [facility]  . Colonoscopy  01/16/2012    Procedure: COLONOSCOPY;  Surgeon: Jamesetta So, MD;  [facility];  Service: Gastroenterology;  Laterality: N/A;  .  Colonoscopy    . Cardiac catheterization  12/2008    [facility]  . Eye surgery Bilateral     cataract  . Lumbar laminectomy/decompression microdiscectomy Right 03/01/2015    Procedure: Right L1 Hemilaminectomy, Foraminotomy;  Surgeon: Marybelle Killings, MD;  Location: [facility];  Service: Orthopedics;  Laterality: Right;     Allergies  Allergen Reactions  . Hydrocodone Other (See Comments)    Hallucinations  . Statins     Pt unsure of reaction       Family History  Problem Relation Age of Onset  . Cancer Mother   . Cancer Father      Social History Lisa Griffith reports that she quit smoking about 18 years ago. Her smoking use included Cigarettes. She started smoking about 53 years ago. She has a 17.5 pack-year smoking history. She has never used smokeless tobacco. Lisa Griffith reports that she does not drink alcohol.   Review of Systems CONSTITUTIONAL: No weight loss, fever, chills, weakness or fatigue.  HEENT: Eyes: No visual loss, blurred vision, double vision or yellow sclerae.No hearing loss, sneezing, congestion, runny nose or sore throat.  SKIN: No rash or itching.  CARDIOVASCULAR: per HPI RESPIRATORY: No shortness of breath, cough or sputum.  GASTROINTESTINAL: No anorexia, nausea, vomiting or diarrhea. No abdominal pain or blood.  GENITOURINARY:  No burning on urination, no polyuria NEUROLOGICAL: No headache, dizziness, syncope, paralysis, ataxia, numbness or tingling in the extremities. No change in bowel or bladder control.  MUSCULOSKELETAL: No muscle, back pain, joint pain or stiffness.  LYMPHATICS: No enlarged nodes. No history of splenectomy.  PSYCHIATRIC: No history of depression or anxiety.  ENDOCRINOLOGIC: No reports of sweating, cold or heat intolerance. No polyuria or polydipsia.  Marland Kitchen   Physical Examination Filed Vitals:   07/07/15 1105  BP: 121/75  Pulse: 71   Filed Vitals:   07/07/15 1105  Height: 5\' 2"  (1.575 m)  Weight: 186 lb (84.369 kg)    Gen: resting  comfortably, no acute distress HEENT: no scleral icterus, pupils equal round and reactive, no palptable cervical adenopathy,  CV: RRR, no m/r/g, no JVD Resp: Clear to auscultation bilaterally GI: abdomen is soft, non-tender, non-distended, normal bowel sounds, no hepatosplenomegaly MSK: extremities are warm, no edema.  Skin: warm, no rash Neuro:  no focal deficits Psych: appropriate affect   Diagnostic Studies  12/2008 Cath HEMODYNAMIC RESULTS: 1. Aortic systolic pressure 295, diastolic pressure 56. 2. Left ventricular systolic pressure 188, end-diastolic pressure 5.  SELECTIVE CHOLANGIOGRAPHY: 1. Left main normal. 2. LAD normal. 3. Left circumflex normal. 4. Ramus intermedius Lisa Griffith which arose as a high marginal was  moderately sized and had a 70% fairly focal proximal stenosis. 5. Right coronary was dominant normal. 6. Left ventriculography; RAO left ventriculogram was performed using  25 mL of Visipaque dye at 12 mL per second. The overall LVEF was  estimated at greater than 60% without focal wall motion  abnormalities.  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 I do not think this is contributing to her symptoms. Continued medical therapy will be recommended.   06/2009 Carotid US Findings:  RIGHT CAROTID ARTERY: There is a hypoechoic structure in the right thyroid tissue which is predominately cystic and measures up to 1 cm. There may be a small septation within this cyst. The right carotid arteries are patent without significant plaque formation.  RIGHT VERTEBRAL ARTERY: Antegrade flow and normal waveform in the right vertebral artery.  LEFT CAROTID ARTERY: Mild amount plaque in the left common carotid artery. There is plaque involving the left carotid bulb. Prominent plaque in the left carotid bulb and proximal left internal carotid artery. This prominent plaque is likely calcified due to  the posterior acoustic shadowing. However, this does not appear to be an occlusive plaque.  LEFT VERTEBRAL ARTERY: Antegrade flow and normal waveform in the left vertebral artery.  IMPRESSION:  Prominent calcific plaque involving the left carotid bulb and the proximal left internal carotid artery. There is no significant internal carotid artery stenosis. Estimated degree of stenosis in the internal carotid arteries is less than 50%.  01/2015 Lexiscan MPI  IMPRESSION: 1. No reversible ischemia or infarction.  2. Normal left ventricular wall motion.  3. Left ventricular ejection fraction 87%  4. Low-risk stress test findings*.  Assessment and Plan  1. CAD - no current symptoms, continue risk factor modification  2. HTN - at goal, continue current meds  3. Hyperlipidemia - request labs from pcp. Continue current statin     F/u 6 months    Arnoldo Lenis, M.D

## 2015-07-07 NOTE — Patient Instructions (Signed)
Your physician wants you to follow-up in: [place] DR. BRANCH You will receive a reminder letter in the mail two months in advance. If you don't receive a letter, please call our office to schedule the follow-up appointment.  Your physician recommends that you continue on your current medications as directed. Please refer to the Current Medication list given to you today.  WE HAVE ADDED ASPIRIN 81 MG TO YOUR MEDICATION LIST   Thank you for choosing [facility]!!

## 2015-07-13 ENCOUNTER — Encounter: Payer: Self-pay | Admitting: *Deleted

## 2015-09-20 DIAGNOSIS — Z1389 Encounter for screening for other disorder: Secondary | ICD-10-CM | POA: Diagnosis not present

## 2015-09-20 DIAGNOSIS — L570 Actinic keratosis: Secondary | ICD-10-CM | POA: Diagnosis not present

## 2015-11-01 DIAGNOSIS — L570 Actinic keratosis: Secondary | ICD-10-CM | POA: Diagnosis not present

## 2015-11-01 DIAGNOSIS — L821 Other seborrheic keratosis: Secondary | ICD-10-CM | POA: Diagnosis not present

## 2015-11-01 DIAGNOSIS — D225 Melanocytic nevi of trunk: Secondary | ICD-10-CM | POA: Diagnosis not present

## 2015-11-01 DIAGNOSIS — B079 Viral wart, unspecified: Secondary | ICD-10-CM | POA: Diagnosis not present

## 2015-11-01 DIAGNOSIS — D045 Carcinoma in situ of skin of trunk: Secondary | ICD-10-CM | POA: Diagnosis not present

## 2015-11-01 DIAGNOSIS — X32XXXA Exposure to sunlight, initial encounter: Secondary | ICD-10-CM | POA: Diagnosis not present

## 2015-12-16 ENCOUNTER — Other Ambulatory Visit (HOSPITAL_COMMUNITY)
Admission: RE | Admit: 2015-12-16 | Discharge: 2015-12-16 | Disposition: A | Discharge status: Home / Self Care | Payer: Medicare Other | Source: Ambulatory Visit | Attending: Orthopaedic Surgery | Admitting: Orthopaedic Surgery

## 2015-12-16 ENCOUNTER — Other Ambulatory Visit (HOSPITAL_COMMUNITY): Payer: Self-pay | Admitting: Orthopaedic Surgery

## 2015-12-16 DIAGNOSIS — M79641 Pain in right hand: Secondary | ICD-10-CM | POA: Insufficient documentation

## 2015-12-16 DIAGNOSIS — M79642 Pain in left hand: Secondary | ICD-10-CM | POA: Insufficient documentation

## 2015-12-16 DIAGNOSIS — M79643 Pain in unspecified hand: Secondary | ICD-10-CM

## 2015-12-16 DIAGNOSIS — M069 Rheumatoid arthritis, unspecified: Secondary | ICD-10-CM | POA: Insufficient documentation

## 2015-12-16 DIAGNOSIS — M542 Cervicalgia: Secondary | ICD-10-CM | POA: Diagnosis not present

## 2015-12-16 LAB — URIC ACID: Uric Acid, Serum: 6.7 mg/dL — ABNORMAL HIGH (ref 2.3–6.6)

## 2015-12-16 LAB — SEDIMENTATION RATE: Sed Rate: 5 mm/hr (ref 0–22)

## 2015-12-16 LAB — C-REACTIVE PROTEIN: CRP: 1.7 mg/dL — ABNORMAL HIGH (ref <1.0)

## 2015-12-17 LAB — RHEUMATOID FACTOR: Rhuematoid fact SerPl-aCnc: <10 IU/mL (ref 0.0–13.9)

## 2015-12-17 LAB — ANTINUCLEAR ANTIBODIES, IFA: ANTINUCLEAR ANTIBODIES, IFA: NEGATIVE

## 2016-01-03 ENCOUNTER — Ambulatory Visit (HOSPITAL_COMMUNITY)
Admission: RE | Admit: 2016-01-03 | Discharge: 2016-01-03 | Disposition: A | Discharge status: Home / Self Care | Payer: Medicare Other | Source: Ambulatory Visit | Attending: Orthopaedic Surgery | Admitting: Orthopaedic Surgery

## 2016-01-03 DIAGNOSIS — M79641 Pain in right hand: Secondary | ICD-10-CM | POA: Insufficient documentation

## 2016-01-03 DIAGNOSIS — Z981 Arthrodesis status: Secondary | ICD-10-CM | POA: Insufficient documentation

## 2016-01-03 DIAGNOSIS — M542 Cervicalgia: Secondary | ICD-10-CM | POA: Diagnosis not present

## 2016-01-03 DIAGNOSIS — M4802 Spinal stenosis, cervical region: Secondary | ICD-10-CM | POA: Diagnosis not present

## 2016-01-03 DIAGNOSIS — M79642 Pain in left hand: Secondary | ICD-10-CM | POA: Insufficient documentation

## 2016-01-03 DIAGNOSIS — M79643 Pain in unspecified hand: Secondary | ICD-10-CM

## 2016-01-03 DIAGNOSIS — M4322 Fusion of spine, cervical region: Secondary | ICD-10-CM | POA: Diagnosis not present

## 2016-01-13 DIAGNOSIS — M79641 Pain in right hand: Secondary | ICD-10-CM | POA: Diagnosis not present

## 2016-01-13 DIAGNOSIS — M542 Cervicalgia: Secondary | ICD-10-CM | POA: Diagnosis not present

## 2016-01-21 DIAGNOSIS — R202 Paresthesia of skin: Secondary | ICD-10-CM | POA: Diagnosis not present

## 2016-01-27 DIAGNOSIS — M542 Cervicalgia: Secondary | ICD-10-CM | POA: Diagnosis not present

## 2016-01-27 DIAGNOSIS — M79641 Pain in right hand: Secondary | ICD-10-CM | POA: Diagnosis not present

## 2016-02-07 DIAGNOSIS — M25641 Stiffness of right hand, not elsewhere classified: Secondary | ICD-10-CM | POA: Diagnosis not present

## 2016-02-07 DIAGNOSIS — M25642 Stiffness of left hand, not elsewhere classified: Secondary | ICD-10-CM | POA: Diagnosis not present

## 2016-02-07 DIAGNOSIS — M79642 Pain in left hand: Secondary | ICD-10-CM | POA: Diagnosis not present

## 2016-02-07 DIAGNOSIS — M79641 Pain in right hand: Secondary | ICD-10-CM | POA: Diagnosis not present

## 2016-02-10 DIAGNOSIS — M25641 Stiffness of right hand, not elsewhere classified: Secondary | ICD-10-CM | POA: Diagnosis not present

## 2016-02-10 DIAGNOSIS — M79642 Pain in left hand: Secondary | ICD-10-CM | POA: Diagnosis not present

## 2016-02-10 DIAGNOSIS — M25642 Stiffness of left hand, not elsewhere classified: Secondary | ICD-10-CM | POA: Diagnosis not present

## 2016-02-10 DIAGNOSIS — M79641 Pain in right hand: Secondary | ICD-10-CM | POA: Diagnosis not present

## 2016-02-14 DIAGNOSIS — M79641 Pain in right hand: Secondary | ICD-10-CM | POA: Diagnosis not present

## 2016-02-14 DIAGNOSIS — M25641 Stiffness of right hand, not elsewhere classified: Secondary | ICD-10-CM | POA: Diagnosis not present

## 2016-02-14 DIAGNOSIS — M25642 Stiffness of left hand, not elsewhere classified: Secondary | ICD-10-CM | POA: Diagnosis not present

## 2016-02-14 DIAGNOSIS — M79642 Pain in left hand: Secondary | ICD-10-CM | POA: Diagnosis not present

## 2016-02-17 DIAGNOSIS — M25642 Stiffness of left hand, not elsewhere classified: Secondary | ICD-10-CM | POA: Diagnosis not present

## 2016-02-17 DIAGNOSIS — M79641 Pain in right hand: Secondary | ICD-10-CM | POA: Diagnosis not present

## 2016-02-17 DIAGNOSIS — M79642 Pain in left hand: Secondary | ICD-10-CM | POA: Diagnosis not present

## 2016-02-17 DIAGNOSIS — M25641 Stiffness of right hand, not elsewhere classified: Secondary | ICD-10-CM | POA: Diagnosis not present

## 2016-02-22 DIAGNOSIS — M25641 Stiffness of right hand, not elsewhere classified: Secondary | ICD-10-CM | POA: Diagnosis not present

## 2016-02-22 DIAGNOSIS — M79642 Pain in left hand: Secondary | ICD-10-CM | POA: Diagnosis not present

## 2016-02-22 DIAGNOSIS — M79641 Pain in right hand: Secondary | ICD-10-CM | POA: Diagnosis not present

## 2016-02-22 DIAGNOSIS — M25642 Stiffness of left hand, not elsewhere classified: Secondary | ICD-10-CM | POA: Diagnosis not present

## 2016-02-28 DIAGNOSIS — M79641 Pain in right hand: Secondary | ICD-10-CM | POA: Diagnosis not present

## 2016-02-28 DIAGNOSIS — M25642 Stiffness of left hand, not elsewhere classified: Secondary | ICD-10-CM | POA: Diagnosis not present

## 2016-02-28 DIAGNOSIS — M79642 Pain in left hand: Secondary | ICD-10-CM | POA: Diagnosis not present

## 2016-02-28 DIAGNOSIS — M25641 Stiffness of right hand, not elsewhere classified: Secondary | ICD-10-CM | POA: Diagnosis not present

## 2016-03-02 DIAGNOSIS — M79641 Pain in right hand: Secondary | ICD-10-CM | POA: Diagnosis not present

## 2016-03-02 DIAGNOSIS — M25641 Stiffness of right hand, not elsewhere classified: Secondary | ICD-10-CM | POA: Diagnosis not present

## 2016-03-02 DIAGNOSIS — M79642 Pain in left hand: Secondary | ICD-10-CM | POA: Diagnosis not present

## 2016-03-02 DIAGNOSIS — M25642 Stiffness of left hand, not elsewhere classified: Secondary | ICD-10-CM | POA: Diagnosis not present

## 2016-03-06 DIAGNOSIS — M79641 Pain in right hand: Secondary | ICD-10-CM | POA: Diagnosis not present

## 2016-03-06 DIAGNOSIS — M25641 Stiffness of right hand, not elsewhere classified: Secondary | ICD-10-CM | POA: Diagnosis not present

## 2016-03-06 DIAGNOSIS — M25642 Stiffness of left hand, not elsewhere classified: Secondary | ICD-10-CM | POA: Diagnosis not present

## 2016-03-06 DIAGNOSIS — M79642 Pain in left hand: Secondary | ICD-10-CM | POA: Diagnosis not present

## 2016-06-06 ENCOUNTER — Other Ambulatory Visit (HOSPITAL_COMMUNITY): Payer: Self-pay | Admitting: Family Medicine

## 2016-06-06 DIAGNOSIS — Z1231 Encounter for screening mammogram for malignant neoplasm of breast: Secondary | ICD-10-CM

## 2016-06-22 ENCOUNTER — Ambulatory Visit (HOSPITAL_COMMUNITY): Payer: Self-pay

## 2016-06-26 ENCOUNTER — Ambulatory Visit (HOSPITAL_COMMUNITY)
Admission: RE | Admit: 2016-06-26 | Discharge: 2016-06-26 | Disposition: A | Discharge status: Home / Self Care | Payer: Medicare Other | Source: Ambulatory Visit | Attending: Family Medicine | Admitting: Family Medicine

## 2016-06-26 DIAGNOSIS — Z1231 Encounter for screening mammogram for malignant neoplasm of breast: Secondary | ICD-10-CM | POA: Insufficient documentation

## 2016-08-01 DIAGNOSIS — Z961 Presence of intraocular lens: Secondary | ICD-10-CM | POA: Diagnosis not present

## 2016-08-01 DIAGNOSIS — H52203 Unspecified astigmatism, bilateral: Secondary | ICD-10-CM | POA: Diagnosis not present

## 2016-08-01 DIAGNOSIS — H524 Presbyopia: Secondary | ICD-10-CM | POA: Diagnosis not present

## 2016-08-07 DIAGNOSIS — Z1389 Encounter for screening for other disorder: Secondary | ICD-10-CM | POA: Diagnosis not present

## 2016-08-07 DIAGNOSIS — E782 Mixed hyperlipidemia: Secondary | ICD-10-CM | POA: Diagnosis not present

## 2016-08-07 DIAGNOSIS — I10 Essential (primary) hypertension: Secondary | ICD-10-CM | POA: Diagnosis not present

## 2016-08-07 DIAGNOSIS — R7309 Other abnormal glucose: Secondary | ICD-10-CM | POA: Diagnosis not present

## 2016-10-04 DIAGNOSIS — R070 Pain in throat: Secondary | ICD-10-CM | POA: Diagnosis not present

## 2016-10-04 DIAGNOSIS — R6889 Other general symptoms and signs: Secondary | ICD-10-CM | POA: Diagnosis not present

## 2016-10-04 DIAGNOSIS — Z1389 Encounter for screening for other disorder: Secondary | ICD-10-CM | POA: Diagnosis not present

## 2016-10-04 DIAGNOSIS — J343 Hypertrophy of nasal turbinates: Secondary | ICD-10-CM | POA: Diagnosis not present

## 2016-10-04 DIAGNOSIS — B349 Viral infection, unspecified: Secondary | ICD-10-CM | POA: Diagnosis not present

## 2016-10-04 DIAGNOSIS — I10 Essential (primary) hypertension: Secondary | ICD-10-CM | POA: Diagnosis not present

## 2017-01-29 DIAGNOSIS — Z1389 Encounter for screening for other disorder: Secondary | ICD-10-CM | POA: Diagnosis not present

## 2017-01-29 DIAGNOSIS — E782 Mixed hyperlipidemia: Secondary | ICD-10-CM | POA: Diagnosis not present

## 2017-01-29 DIAGNOSIS — Z0001 Encounter for general adult medical examination with abnormal findings: Secondary | ICD-10-CM | POA: Diagnosis not present

## 2017-01-29 DIAGNOSIS — R7309 Other abnormal glucose: Secondary | ICD-10-CM | POA: Diagnosis not present

## 2017-01-30 DIAGNOSIS — I10 Essential (primary) hypertension: Secondary | ICD-10-CM | POA: Diagnosis not present

## 2017-01-30 DIAGNOSIS — Z8673 Personal history of transient ischemic attack (TIA), and cerebral infarction without residual deficits: Secondary | ICD-10-CM | POA: Diagnosis not present

## 2017-01-30 DIAGNOSIS — E782 Mixed hyperlipidemia: Secondary | ICD-10-CM | POA: Diagnosis not present

## 2017-01-30 DIAGNOSIS — T466X5D Adverse effect of antihyperlipidemic and antiarteriosclerotic drugs, subsequent encounter: Secondary | ICD-10-CM | POA: Diagnosis not present

## 2017-02-01 DIAGNOSIS — J069 Acute upper respiratory infection, unspecified: Secondary | ICD-10-CM | POA: Diagnosis not present

## 2017-02-01 DIAGNOSIS — J209 Acute bronchitis, unspecified: Secondary | ICD-10-CM | POA: Diagnosis not present

## 2017-02-20 DIAGNOSIS — E782 Mixed hyperlipidemia: Secondary | ICD-10-CM | POA: Diagnosis not present

## 2017-02-21 DIAGNOSIS — R7309 Other abnormal glucose: Secondary | ICD-10-CM | POA: Diagnosis not present

## 2017-03-23 DIAGNOSIS — E782 Mixed hyperlipidemia: Secondary | ICD-10-CM | POA: Diagnosis not present

## 2017-07-10 DIAGNOSIS — I10 Essential (primary) hypertension: Secondary | ICD-10-CM | POA: Diagnosis not present

## 2017-07-10 DIAGNOSIS — T466X5D Adverse effect of antihyperlipidemic and antiarteriosclerotic drugs, subsequent encounter: Secondary | ICD-10-CM | POA: Diagnosis not present

## 2017-07-10 DIAGNOSIS — R7309 Other abnormal glucose: Secondary | ICD-10-CM | POA: Diagnosis not present

## 2017-07-10 DIAGNOSIS — I2510 Atherosclerotic heart disease of native coronary artery without angina pectoris: Secondary | ICD-10-CM | POA: Diagnosis not present

## 2017-07-10 DIAGNOSIS — E782 Mixed hyperlipidemia: Secondary | ICD-10-CM | POA: Diagnosis not present

## 2017-07-31 ENCOUNTER — Other Ambulatory Visit (HOSPITAL_COMMUNITY): Payer: Self-pay | Admitting: Family Medicine

## 2017-07-31 DIAGNOSIS — Z1231 Encounter for screening mammogram for malignant neoplasm of breast: Secondary | ICD-10-CM

## 2017-08-08 ENCOUNTER — Ambulatory Visit (HOSPITAL_COMMUNITY)
Admission: RE | Admit: 2017-08-08 | Discharge: 2017-08-08 | Disposition: A | Discharge status: Home / Self Care | Payer: Medicare Other | Source: Ambulatory Visit | Attending: Family Medicine | Admitting: Family Medicine

## 2017-08-08 DIAGNOSIS — Z1231 Encounter for screening mammogram for malignant neoplasm of breast: Secondary | ICD-10-CM | POA: Insufficient documentation

## 2017-11-14 DIAGNOSIS — M5416 Radiculopathy, lumbar region: Secondary | ICD-10-CM | POA: Diagnosis not present

## 2017-11-14 DIAGNOSIS — M7061 Trochanteric bursitis, right hip: Secondary | ICD-10-CM | POA: Diagnosis not present

## 2017-11-14 DIAGNOSIS — M545 Low back pain: Secondary | ICD-10-CM | POA: Diagnosis not present

## 2017-11-29 DIAGNOSIS — J069 Acute upper respiratory infection, unspecified: Secondary | ICD-10-CM | POA: Diagnosis not present

## 2017-11-29 DIAGNOSIS — Z23 Encounter for immunization: Secondary | ICD-10-CM | POA: Diagnosis not present

## 2017-11-29 DIAGNOSIS — Z1389 Encounter for screening for other disorder: Secondary | ICD-10-CM | POA: Diagnosis not present

## 2018-05-17 DIAGNOSIS — Z0001 Encounter for general adult medical examination with abnormal findings: Secondary | ICD-10-CM | POA: Diagnosis not present

## 2018-05-17 DIAGNOSIS — R7309 Other abnormal glucose: Secondary | ICD-10-CM | POA: Diagnosis not present

## 2018-05-17 DIAGNOSIS — I2510 Atherosclerotic heart disease of native coronary artery without angina pectoris: Secondary | ICD-10-CM | POA: Diagnosis not present

## 2018-05-17 DIAGNOSIS — Z1389 Encounter for screening for other disorder: Secondary | ICD-10-CM | POA: Diagnosis not present

## 2018-05-17 DIAGNOSIS — I10 Essential (primary) hypertension: Secondary | ICD-10-CM | POA: Diagnosis not present

## 2018-06-28 ENCOUNTER — Other Ambulatory Visit (HOSPITAL_COMMUNITY): Payer: Self-pay | Admitting: Family Medicine

## 2018-06-28 DIAGNOSIS — Z1231 Encounter for screening mammogram for malignant neoplasm of breast: Secondary | ICD-10-CM

## 2018-08-12 ENCOUNTER — Ambulatory Visit (HOSPITAL_COMMUNITY)
Admission: RE | Admit: 2018-08-12 | Discharge: 2018-08-12 | Disposition: A | Discharge status: Home / Self Care | Payer: Medicare Other | Source: Ambulatory Visit | Attending: Family Medicine | Admitting: Family Medicine

## 2018-08-12 DIAGNOSIS — Z1231 Encounter for screening mammogram for malignant neoplasm of breast: Secondary | ICD-10-CM | POA: Insufficient documentation

## 2018-11-06 DIAGNOSIS — J029 Acute pharyngitis, unspecified: Secondary | ICD-10-CM | POA: Diagnosis not present

## 2018-11-06 DIAGNOSIS — J069 Acute upper respiratory infection, unspecified: Secondary | ICD-10-CM | POA: Diagnosis not present

## 2018-11-06 DIAGNOSIS — H6121 Impacted cerumen, right ear: Secondary | ICD-10-CM | POA: Diagnosis not present

## 2019-05-08 DIAGNOSIS — L570 Actinic keratosis: Secondary | ICD-10-CM | POA: Diagnosis not present

## 2019-05-08 DIAGNOSIS — L578 Other skin changes due to chronic exposure to nonionizing radiation: Secondary | ICD-10-CM | POA: Diagnosis not present

## 2019-05-08 DIAGNOSIS — X32XXXD Exposure to sunlight, subsequent encounter: Secondary | ICD-10-CM | POA: Diagnosis not present

## 2019-07-03 ENCOUNTER — Other Ambulatory Visit (HOSPITAL_COMMUNITY): Payer: Self-pay | Admitting: Family Medicine

## 2019-07-03 DIAGNOSIS — Z1231 Encounter for screening mammogram for malignant neoplasm of breast: Secondary | ICD-10-CM

## 2019-07-16 DIAGNOSIS — Z0000 Encounter for general adult medical examination without abnormal findings: Secondary | ICD-10-CM | POA: Diagnosis not present

## 2019-07-16 DIAGNOSIS — Z0001 Encounter for general adult medical examination with abnormal findings: Secondary | ICD-10-CM | POA: Diagnosis not present

## 2019-07-16 DIAGNOSIS — R7309 Other abnormal glucose: Secondary | ICD-10-CM | POA: Diagnosis not present

## 2019-07-16 DIAGNOSIS — Z1389 Encounter for screening for other disorder: Secondary | ICD-10-CM | POA: Diagnosis not present

## 2019-07-16 DIAGNOSIS — I10 Essential (primary) hypertension: Secondary | ICD-10-CM | POA: Diagnosis not present

## 2019-07-16 DIAGNOSIS — E7849 Other hyperlipidemia: Secondary | ICD-10-CM | POA: Diagnosis not present

## 2019-07-28 DIAGNOSIS — R21 Rash and other nonspecific skin eruption: Secondary | ICD-10-CM | POA: Diagnosis not present

## 2019-08-11 DIAGNOSIS — R21 Rash and other nonspecific skin eruption: Secondary | ICD-10-CM | POA: Diagnosis not present

## 2019-08-11 DIAGNOSIS — S50911D Unspecified superficial injury of right forearm, subsequent encounter: Secondary | ICD-10-CM | POA: Diagnosis not present

## 2019-08-12 ENCOUNTER — Ambulatory Visit (HOSPITAL_COMMUNITY): Payer: Medicare Other | Attending: Family Medicine | Admitting: Physical Therapy

## 2019-08-12 ENCOUNTER — Other Ambulatory Visit: Payer: Self-pay

## 2019-08-12 ENCOUNTER — Encounter (HOSPITAL_COMMUNITY): Payer: Self-pay | Admitting: Physical Therapy

## 2019-08-12 DIAGNOSIS — T22212A Burn of second degree of left forearm, initial encounter: Secondary | ICD-10-CM

## 2019-08-12 DIAGNOSIS — M79632 Pain in left forearm: Secondary | ICD-10-CM | POA: Diagnosis not present

## 2019-08-12 DIAGNOSIS — T22211A Burn of second degree of right forearm, initial encounter: Secondary | ICD-10-CM | POA: Insufficient documentation

## 2019-08-12 NOTE — Therapy (Signed)
[place], [place], [address] Phone: [phone]   Fax:  [phone]  Wound Care Evaluation  Patient Details  Name: Lisa Griffith MRN: [id number] Date of Birth: 1945/12/27 Referring Provider (PT): Sharilyn Sites    Encounter Date: 08/12/2019  PT End of Session - 08/12/19 1430    Visit Number  1    Number of Visits  16    Date for PT Re-Evaluation  10/11/19    PT Start Time  0830    PT Stop Time  0945    PT Time Calculation (min)  75 min    Activity Tolerance  Patient tolerated treatment well    Behavior During Therapy  [facility] for tasks assessed/performed       Past Medical History:  Diagnosis Date  . Back muscle spasm   . Constipation   . Coronary artery disease   . GERD (gastroesophageal reflux disease)   . Headache(784.0)   . Hypertension   . Ruptured lumbar disc   . Seasonal allergies   . Stroke [facility]) 2010   "mild"     Past Surgical History:  Procedure Laterality Date  . CARDIAC CATHETERIZATION  12/2008   [facility]  . CATARACT EXTRACTION, BILATERAL  2008   [facility]  . COLONOSCOPY  01/16/2012   Procedure: COLONOSCOPY;  Surgeon: Jamesetta So, MD;  [facility];  Service: Gastroenterology;  Laterality: N/A;  . COLONOSCOPY    . EYE SURGERY Bilateral    cataract  . LUMBAR LAMINECTOMY/DECOMPRESSION MICRODISCECTOMY Right 03/01/2015   Procedure: Right L1 Hemilaminectomy, Foraminotomy;  Surgeon: Marybelle Killings, MD;  Location: [facility];  Service: Orthopedics;  Laterality: Right;  . POSTERIOR LAMINECTOMY / DECOMPRESSION CERVICAL SPINE     [facility]    There were no vitals filed for this visit.    [facility] PT Assessment - 08/12/19 0001      Assessment   Medical Diagnosis  B forearm nonhealing wounds     Referring Provider (PT)  Sharilyn Sites     Onset Date/Surgical Date  02/09/19    Hand Dominance  Right    Next MD Visit  October     Prior Therapy  3 MD's , antibiotic, multiple creams       Precautions   Precautions  --   cellulitis      Restrictions   Weight Bearing Restrictions  No      Balance Screen   Has the patient fallen in the past 6 months  No    Has the patient had a decrease in activity level because of a fear of falling?   No    Is the patient reluctant to leave their home because of a fear of falling?   No      Home Environment   Living Environment  Private residence      Prior Function   Level of Independence  Independent      Cognition   Overall Cognitive Status  Within Functional Limits for tasks assessed      Wound Therapy - 08/12/19 1356    Subjective  [name] states that she was refinishing her back deck in March, it took over 5 hours, when she was done she noted blisters coming up on her forearms. After the blisters would not heal she went to a dermatologist and was given some cream but the wounds continued so she went to urgent care and was given another cream.  She continues cleansing  and using the cream but the blister areas still were not healing therefore she went to her primary MD.  After, two visits and no improvement the pt is now being referred to skilled Physical therapy for wound care.     Patient and Family Stated Goals  wounds to heal.     Date of Onset  02/09/19    Prior Treatments  antibiotic, and multiple creams.     Pain Scale  0-10    Pain Score  4    LT more than RT, increased to an 8 with debridement.   Pain Type  Acute pain    Pain Location  Arm    Pain Orientation  Right;Left    Pain Descriptors / Indicators  Discomfort    Pain Onset  On-going    Patients Stated Pain Goal  0    Pain Intervention(s)  Emotional support    Evaluation and Treatment Procedures Explained to Patient/Family  Yes    Evaluation and Treatment Procedures  agreed to    Wound Properties Date First Assessed: 08/12/19 Time First Assessed: 0840 Wound Type: Burn Location: Arm Location Orientation: Right Wound Description (Comments): Rt forearm from  elbow to wrist  has multiple wounds, (16+), scattered along dorsal aspect.  Most prominent is below.   Dressing Type  --   comes with no dressing on UE   Dressing Changed  New    Dressing Status  None    Dressing Change Frequency  PRN    Site / Wound Assessment  Dry;Brown;Yellow    % Wound base Red or Granulating  0%    % Wound base Yellow/Fibrinous Exudate  100%    Peri-wound Assessment  Intact    Wound Length (cm)  0.9 cm    Wound Width (cm)  0.9 cm    Wound Depth (cm)  --   unknown   Wound Surface Area (cm^2)  0.81 cm^2    Drainage Amount  Scant    Drainage Description  Serous    Treatment  Cleansed;Debridement (Selective);Other (Comment)    Wound Properties Date First Assessed: 08/12/19 Time First Assessed: 0900 Wound Type: Burn Location: Arm Location Orientation: Left Wound Description (Comments): multiple woundson LT forearm dorsal aspect from elbow to wrist; the most proximal  Present on Admission: Yes   Dressing Changed  New    Dressing Status  None    Dressing Change Frequency  PRN    Site / Wound Assessment  Dry;Brown    % Wound base Red or Granulating  40%    % Wound base Yellow/Fibrinous Exudate  60%    Peri-wound Assessment  Intact    Wound Length (cm)  1.2 cm    Wound Width (cm)  1.2 cm    Wound Depth (cm)  0.3 cm    Wound Volume (cm^3)  0.43 cm^3    Wound Surface Area (cm^2)  1.44 cm^2    Drainage Amount  Scant    Drainage Description  Serous    Treatment  Cleansed;Debridement (Selective)    Wound Properties Date First Assessed: 08/12/19 Time First Assessed: 0900 Wound Type: Burn Location: Arm Location Orientation: Left Wound Description (Comments): forearm; distal to 1st wound;  Present on Admission: Yes   Dressing Changed  New    Dressing Status  None    Dressing Change Frequency  PRN    Site / Wound Assessment  Dry;Brown;Dusky    % Wound base Red or Granulating  10%    % Wound base  Yellow/Fibrinous Exudate  90%    Peri-wound Assessment  Intact    Wound  Length (cm)  4 cm    Wound Width (cm)  2.5 cm    Wound Depth (cm)  0.3 cm    Wound Volume (cm^3)  3 cm^3    Wound Surface Area (cm^2)  10 cm^2    Drainage Amount  Scant    Drainage Description  Serous    Treatment  Cleansed;Debridement (Selective)    Wound Properties Date First Assessed: 08/12/19 Time First Assessed: 0921 Wound Type: Burn Location: Arm Location Orientation: Left Wound Description (Comments): third  main wound on forearm ,multiple other small areas involved    Dressing Changed  New    Dressing Status  None    Dressing Change Frequency  PRN    Site / Wound Assessment  Brown;Dusky    % Wound base Red or Granulating  0%    % Wound base Yellow/Fibrinous Exudate  100%    Peri-wound Assessment  Intact    Wound Length (cm)  1.3 cm    Wound Width (cm)  1.3 cm    Wound Depth (cm)  --   unknown   Wound Surface Area (cm^2)  1.69 cm^2    Drainage Amount  Scant    Treatment  Cleansed;Debridement (Selective)    Selective Debridement - Location  all wound beds    Selective Debridement - Tools Used  Forceps;Scalpel    Selective Debridement - Tissue Removed  slough     Wound Therapy - Clinical Statement  Ms. Pilkenton is a 73yo female who has chronic wounds on the dorsal aspect of both forearms following a day of refinishing her deck.  She has tried nunerous creams without improvement therefore she is being referred to skilled PT .  Ms. Feiner will benefit from cleansing, debridement and dressing change using mild compression on both of her forearms to make an environment that is conductive to healing.     Hydrotherapy Plan  Debridement;Dressing change;Patient/family education;Other (comment)   cleanse and moisturize    Wound Therapy - Frequency  2X / week    Wound Therapy - Current Recommendations  PT    Wound Plan  Continue to cleanse, moisturize, debride and dress pt wounds.  Continue with medihoney and xeroform as long as pt is responding well .     Dressing   medihoney,  xeroform, spandex, cotton followed by coban for light compression.              Objective measurements completed on examination: See above findings.            PT Education - 08/12/19 1429    Education Details  Dressing should remain dry, there should be no pain.  If she has anypain take the brown outer dressing off and see if this is enought to decreased her discomfort.    Person(s) Educated  Patient    Methods  Explanation    Comprehension  Verbalized understanding       PT Short Term Goals - 08/12/19 1438      PT SHORT TERM GOAL #1   Title  PT wounds to be 80 % granulated on both forearms    Time  4    Period  Weeks    Status  New    Target Date  09/09/19      PT SHORT TERM GOAL #2   Title  PT pain level to be no greater than a 2/10  Time  4    Period  Weeks    Status  New        PT Long Term Goals - 08/12/19 1437      PT LONG TERM GOAL #1   Title  All wounds to be healed on both forearms.    Time  6    Period  Weeks    Status  New    Target Date  09/23/19           Plan - 08/12/19 1431    Clinical Impression Statement  see above    Personal Factors and Comorbidities  Time since onset of injury/illness/exacerbation    Examination-Activity Limitations  Dressing;Hygiene/Grooming    Examination-Participation Restrictions  Cleaning    Stability/Clinical Decision Making  Stable/Uncomplicated    Clinical Decision Making  Moderate    Rehab Potential  Good    PT Frequency  2x / week    PT Duration  6 weeks    PT Treatment/Interventions  ADLs/Self Care Home Management;Other (comment)   debridement, dressing change   PT Next Visit Plan  see above       Patient will benefit from skilled therapeutic intervention in order to improve the following deficits and impairments:  Pain, Decreased skin integrity  Visit Diagnosis: Second degree burn of forearm, left, initial encounter  Partial thickness burn of right forearm, initial encounter  Pain  in left forearm    Problem List Patient Active Problem List   Diagnosis Date Noted  . S/P lumbar discectomy 03/01/2015  . 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 Low back pain 09/02/2013   Rayetta Humphrey, PT CLT [phone] 08/12/2019, 2:42 PM  [facility] [address] [place], [place], [address] Phone: [phone]   Fax:  [phone]  Name: Lisa Griffith MRN: [id number] Date of Birth: 07-31-1946

## 2019-08-14 ENCOUNTER — Ambulatory Visit (HOSPITAL_COMMUNITY): Payer: Medicare Other | Admitting: Physical Therapy

## 2019-08-14 ENCOUNTER — Other Ambulatory Visit: Payer: Self-pay

## 2019-08-14 DIAGNOSIS — T22212A Burn of second degree of left forearm, initial encounter: Secondary | ICD-10-CM

## 2019-08-14 DIAGNOSIS — T22211A Burn of second degree of right forearm, initial encounter: Secondary | ICD-10-CM

## 2019-08-14 DIAGNOSIS — M79632 Pain in left forearm: Secondary | ICD-10-CM

## 2019-08-14 NOTE — Therapy (Signed)
[place], [place], [address] Phone: [phone]   Fax:  [phone]  Wound Care Therapy  Patient Details  Name: Lisa Griffith MRN: [id number] Date of Birth: 1946-02-03 Referring Provider (PT): Sharilyn Sites    Encounter Date: 08/14/2019  PT End of Session - 08/14/19 0910    Visit Number  2    Number of Visits  16    Date for PT Re-Evaluation  10/11/19    PT Start Time  0820    PT Stop Time  0900    PT Time Calculation (min)  40 min    Activity Tolerance  Patient tolerated treatment well    Behavior During Therapy  [facility] for tasks assessed/performed       Past Medical History:  Diagnosis Date  . Back muscle spasm   . Constipation   . Coronary artery disease   . GERD (gastroesophageal reflux disease)   . Headache(784.0)   . Hypertension   . Ruptured lumbar disc   . Seasonal allergies   . Stroke [facility]) 2010   "mild"     Past Surgical History:  Procedure Laterality Date  . CARDIAC CATHETERIZATION  12/2008   [place]  . CATARACT EXTRACTION, BILATERAL  2008   [facility]  . COLONOSCOPY  01/16/2012   Procedure: COLONOSCOPY;  Surgeon: Jamesetta So, MD;  [facility];  Service: Gastroenterology;  Laterality: N/A;  . COLONOSCOPY    . EYE SURGERY Bilateral    cataract  . LUMBAR LAMINECTOMY/DECOMPRESSION MICRODISCECTOMY Right 03/01/2015   Procedure: Right L1 Hemilaminectomy, Foraminotomy;  Surgeon: Marybelle Killings, MD;  Location: [facility];  Service: Orthopedics;  Laterality: Right;  . POSTERIOR LAMINECTOMY / DECOMPRESSION CERVICAL SPINE     [place]    There were no vitals filed for this visit.              Wound Therapy - 08/14/19 0901    Subjective  pt states her arms got a little itchy but no other issues.    Patient and Family Stated Goals  wounds to heal.     Date of Onset  02/09/19    Prior Treatments  antibiotic, and multiple creams.     Pain Scale  0-10    Pain Score  2      Pain Type  Acute pain    Pain Location  Arm    Pain Orientation  Right;Left    Pain Descriptors / Indicators  Discomfort    Evaluation and Treatment Procedures Explained to Patient/Family  Yes    Evaluation and Treatment Procedures  agreed to    Wound Properties Date First Assessed: 08/12/19 Time First Assessed: 0840 Wound Type: Burn Location: Arm Location Orientation: Right Wound Description (Comments): Rt forearm from elbow to wrist  has multiple wounds, (16+), scattered along dorsal aspect.  Most prominent is below.   Dressing Type  Gauze (Comment);Impregnated gauze (bismuth)   comes with no dressing on UE   Dressing Changed  Changed    Dressing Status  Intact;Old drainage    Dressing Change Frequency  PRN    Site / Wound Assessment  Yellow    % Wound base Red or Granulating  10%    % Wound base Yellow/Fibrinous Exudate  90%    Peri-wound Assessment  Intact    Drainage Amount  Minimal    Drainage Description  Serosanguineous    Treatment  Cleansed;Debridement (Selective)    Wound Properties Date First Assessed:  08/12/19 Time First Assessed: 0900 Wound Type: Burn Location: Arm Location Orientation: Left Wound Description (Comments): multiple woundson LT forearm dorsal aspect from elbow to wrist; the most proximal  Present on Admission: Yes   Dressing Type  Gauze (Comment);Impregnated gauze (bismuth)    Dressing Changed  Changed    Dressing Status  Intact;Old drainage    Dressing Change Frequency  PRN    Site / Wound Assessment  Dry;Brown    % Wound base Red or Granulating  50%    % Wound base Yellow/Fibrinous Exudate  50%    Peri-wound Assessment  Intact    Drainage Amount  Minimal    Drainage Description  Serosanguineous    Treatment  Cleansed;Debridement (Selective)    Wound Properties Date First Assessed: 08/12/19 Time First Assessed: 0900 Wound Type: Burn Location: Arm Location Orientation: Left Wound Description (Comments): forearm; distal to 1st wound;  Present on Admission:  Yes   Dressing Type  Gauze (Comment);Impregnated gauze (bismuth)    Dressing Changed  Changed    Dressing Status  Intact;Old drainage    Dressing Change Frequency  PRN    Site / Wound Assessment  Dry;Brown;Dusky    % Wound base Red or Granulating  20%    % Wound base Yellow/Fibrinous Exudate  80%    Peri-wound Assessment  Intact    Drainage Amount  Scant    Drainage Description  Serous    Wound Properties Date First Assessed: 08/12/19 Time First Assessed: 0921 Wound Type: Burn Location: Arm Location Orientation: Left Wound Description (Comments): third  main wound on forearm ,multiple other small areas involved    Dressing Type  Gauze (Comment);Impregnated gauze (bismuth)    Dressing Changed  Changed    Dressing Status  None    Dressing Change Frequency  PRN    Site / Wound Assessment  Brown;Dusky    % Wound base Red or Granulating  10%    % Wound base Yellow/Fibrinous Exudate  90%    Peri-wound Assessment  Intact    Drainage Amount  Minimal    Drainage Description  Serosanguineous    Treatment  Cleansed;Debridement (Selective)    Selective Debridement - Location  all wound beds    Selective Debridement - Tools Used  Forceps;Scalpel    Selective Debridement - Tissue Removed  slough     Wound Therapy - Clinical Statement  pt returns today with dressing intact.  Upon removal, some bleeding present from areas where xeroform adhered but overall much improved with less dryness, redness and/or drainage.  Major wounds remain on both UE with all other smaller areas approximated and most dry skin/tissue debrided that was remaining.  Slough continues to be adherent in most wound beds with medihoney helping to loosen these.  Cleansed and moisturized both UE well prior to redressing.  Secured with coban and #5 netting.    Hydrotherapy Plan  Debridement;Dressing change;Patient/family education;Other (comment)   cleanse and moisturize    Wound Therapy - Frequency  2X / week    Wound Therapy - Current  Recommendations  PT    Wound Plan  Continue to cleanse, moisturize, debride and dress pt wounds.  Continue with medihoney and xeroform as long as pt is responding well .     Dressing   medihoney gauze to 4 wounds on Lt and 1 wound on Rt, xerform over other areas, kerlix, coban, #5 netting                PT Short Term  Goals - 08/12/19 1438      PT SHORT TERM GOAL #1   Title  PT wounds to be 80 % granulated on both forearms    Time  4    Period  Weeks    Status  New    Target Date  09/09/19      PT SHORT TERM GOAL #2   Title  PT pain level to be no greater than a 2/10    Time  4    Period  Weeks    Status  New        PT Long Term Goals - 08/12/19 1437      PT LONG TERM GOAL #1   Title  All wounds to be healed on both forearms.    Time  6    Period  Weeks    Status  New    Target Date  09/23/19              Patient will benefit from skilled therapeutic intervention in order to improve the following deficits and impairments:     Visit Diagnosis: Partial thickness burn of right forearm, initial encounter  Pain in left forearm  Second degree burn of forearm, left, initial encounter     Problem List Patient Active Problem List   Diagnosis Date Noted  . S/P lumbar discectomy 03/01/2015  . 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 Low back pain 09/02/2013   Teena Irani, PTA/CLT [phone]  Teena Irani 08/14/2019, 9:11 AM  [place], [place], [address] Phone: [phone]   Fax:  [phone]  Name: KATRELL MALSCH MRN: [id number] Date of Birth: 1946-02-07

## 2019-08-15 ENCOUNTER — Ambulatory Visit (HOSPITAL_COMMUNITY)
Admission: RE | Admit: 2019-08-15 | Discharge: 2019-08-15 | Disposition: A | Discharge status: Home / Self Care | Payer: Medicare Other | Source: Ambulatory Visit | Attending: Family Medicine | Admitting: Family Medicine

## 2019-08-15 DIAGNOSIS — Z1231 Encounter for screening mammogram for malignant neoplasm of breast: Secondary | ICD-10-CM | POA: Diagnosis not present

## 2019-08-19 ENCOUNTER — Other Ambulatory Visit: Payer: Self-pay

## 2019-08-19 ENCOUNTER — Ambulatory Visit (HOSPITAL_COMMUNITY): Payer: Medicare Other | Admitting: Physical Therapy

## 2019-08-19 ENCOUNTER — Encounter (HOSPITAL_COMMUNITY): Payer: Self-pay | Admitting: Physical Therapy

## 2019-08-19 DIAGNOSIS — T22212A Burn of second degree of left forearm, initial encounter: Secondary | ICD-10-CM

## 2019-08-19 DIAGNOSIS — T22211A Burn of second degree of right forearm, initial encounter: Secondary | ICD-10-CM

## 2019-08-19 DIAGNOSIS — M79632 Pain in left forearm: Secondary | ICD-10-CM

## 2019-08-19 NOTE — Therapy (Signed)
[place], [place], [address] Phone: [phone]   Fax:  [phone]  Wound Care Therapy  Patient Details  Name: COREE GATTI MRN: [id number] Date of Birth: 04-19-1946 Referring Provider (PT): Sharilyn Sites    Encounter Date: 08/19/2019  PT End of Session - 08/19/19 0933    Visit Number  3    Number of Visits  16    Date for PT Re-Evaluation  10/11/19    PT Start Time  0825    PT Stop Time  0910    PT Time Calculation (min)  45 min    Activity Tolerance  Patient tolerated treatment well    Behavior During Therapy  [facility] for tasks assessed/performed       Past Medical History:  Diagnosis Date  . Back muscle spasm   . Constipation   . Coronary artery disease   . GERD (gastroesophageal reflux disease)   . Headache(784.0)   . Hypertension   . Ruptured lumbar disc   . Seasonal allergies   . Stroke [facility]) 2010   "mild"     Past Surgical History:  Procedure Laterality Date  . CARDIAC CATHETERIZATION  12/2008   [place]  . CATARACT EXTRACTION, BILATERAL  2008   [facility]  . COLONOSCOPY  01/16/2012   Procedure: COLONOSCOPY;  Surgeon: Jamesetta So, MD;  [facility];  Service: Gastroenterology;  Laterality: N/A;  . COLONOSCOPY    . EYE SURGERY Bilateral    cataract  . LUMBAR LAMINECTOMY/DECOMPRESSION MICRODISCECTOMY Right 03/01/2015   Procedure: Right L1 Hemilaminectomy, Foraminotomy;  Surgeon: Marybelle Killings, MD;  Location: [facility];  Service: Orthopedics;  Laterality: Right;  . POSTERIOR LAMINECTOMY / DECOMPRESSION CERVICAL SPINE     [place]    There were no vitals filed for this visit.              Wound Therapy - 08/19/19 0913    Subjective  Pt states that the bandages wanted to fall down and dug into her wounds making her very uncomfortable.    Patient and Family Stated Goals  wounds to heal.     Date of Onset  02/09/19    Prior Treatments  antibiotic, and multiple creams.      Pain Scale  0-10    Pain Score  6    with debridement otherwise a 2   Pain Intervention(s)  Emotional support    Evaluation and Treatment Procedures Explained to Patient/Family  Yes    Evaluation and Treatment Procedures  agreed to    Wound Properties Date First Assessed: 08/12/19 Time First Assessed: 0840 Wound Type: Burn Location: Arm Location Orientation: Right Wound Description (Comments): Rt forearm from elbow to wrist  has multiple wounds, (16+), scattered along dorsal aspect.  Most prominent is below.   Dressing Type  Gauze (Comment);Impregnated gauze (bismuth)   comes with no dressing on UE   Dressing Changed  Changed    Dressing Status  Intact;Old drainage    Dressing Change Frequency  PRN    Site / Wound Assessment  Yellow    % Wound base Red or Granulating  10%    % Wound base Yellow/Fibrinous Exudate  90%    Peri-wound Assessment  Intact    Wound Length (cm)  0.8 cm    Wound Width (cm)  0.7 cm    Wound Depth (cm)  0.3 cm    Wound Volume (cm^3)  0.17 cm^3    Wound  Surface Area (cm^2)  0.56 cm^2    Drainage Amount  Minimal    Drainage Description  Purulent;No odor    Treatment  Cleansed;Debridement (Selective)    Wound Properties Date First Assessed: 08/12/19 Time First Assessed: 0900 Wound Type: Burn Location: Arm Location Orientation: Left Wound Description (Comments): multiple woundson LT forearm dorsal aspect from elbow to wrist; the most proximal  Present on Admission: Yes   Dressing Type  Gauze (Comment);Impregnated gauze (bismuth)    Dressing Changed  Changed    Dressing Status  Intact;Old drainage    Dressing Change Frequency  PRN    Site / Wound Assessment  Dry;Brown    % Wound base Red or Granulating  50%    % Wound base Yellow/Fibrinous Exudate  50%    Peri-wound Assessment  Intact    Wound Length (cm)  2.5 cm    Wound Width (cm)  1.2 cm    Wound Depth (cm)  0.2 cm    Wound Volume (cm^3)  0.6 cm^3    Wound Surface Area (cm^2)  3 cm^2    Drainage Amount   Minimal    Drainage Description  Serosanguineous    Wound Properties Date First Assessed: 08/12/19 Time First Assessed: 0900 Wound Type: Burn Location: Arm Location Orientation: Left Wound Description (Comments): forearm; distal to 1st wound;  Present on Admission: Yes   Dressing Type  Gauze (Comment);Impregnated gauze (bismuth)    Dressing Status  Intact;Old drainage    Dressing Change Frequency  PRN    Site / Wound Assessment  Dry;Brown;Dusky    % Wound base Red or Granulating  30%    % Wound base Yellow/Fibrinous Exudate  70%    Peri-wound Assessment  Intact    Wound Length (cm)  3.5 cm    Wound Width (cm)  2.3 cm    Wound Depth (cm)  0.3 cm    Wound Volume (cm^3)  2.42 cm^3    Wound Surface Area (cm^2)  8.05 cm^2    Drainage Amount  Scant    Drainage Description  Serous    Treatment  Cleansed;Debridement (Selective)    Wound Properties Date First Assessed: 08/12/19 Time First Assessed: 0921 Wound Type: Burn Location: Arm Location Orientation: Left Wound Description (Comments): third  main wound on forearm ,multiple other small areas involved    Dressing Type  Gauze (Comment);Impregnated gauze (bismuth)    Dressing Status  None    Dressing Change Frequency  PRN    Site / Wound Assessment  Brown;Dusky    % Wound base Red or Granulating  10%    % Wound base Yellow/Fibrinous Exudate  90%    Peri-wound Assessment  Intact    Wound Length (cm)  1.2 cm    Wound Width (cm)  1 cm    Wound Depth (cm)  0.3 cm    Wound Volume (cm^3)  0.36 cm^3    Wound Surface Area (cm^2)  1.2 cm^2    Drainage Amount  Minimal    Drainage Description  Serosanguineous    Treatment  Cleansed;Debridement (Selective)    Wound Properties Date First Assessed: 08/19/19 Time First Assessed: 0821 Wound Type: Burn Location: Arm Location Orientation: Left Wound Description (Comments): 4th wound going proximal to distal on Lt dorsal aspect of arm  Present on Admission: Yes   Dressing Type  Gauze (Comment);Impregnated  gauze (bismuth)    Dressing Changed  Changed    Dressing Status  New drainage    Dressing Change Frequency  PRN    Site / Wound Assessment  Yellow    % Wound base Red or Granulating  0%    % Wound base Yellow/Fibrinous Exudate  100%    Peri-wound Assessment  Edema    Wound Length (cm)  0.3 cm    Wound Width (cm)  0.5 cm    Wound Depth (cm)  0.5 cm    Wound Volume (cm^3)  0.08 cm^3    Wound Surface Area (cm^2)  0.15 cm^2    Drainage Amount  Scant    Drainage Description  Purulent    Treatment  Cleansed;Debridement (Selective)    Wound Properties Date First Assessed: 08/19/19 Time First Assessed: 0824 Wound Type: Burn Location: Arm Location Orientation: Left Wound Description (Comments): most distal of wounds  Present on Admission: Yes   Dressing Type  Gauze (Comment);Impregnated gauze (bismuth)    Dressing Changed  Changed    Dressing Status  Old drainage    Dressing Change Frequency  PRN    Site / Wound Assessment  Granulation tissue;Yellow    % Wound base Red or Granulating  90%    % Wound base Yellow/Fibrinous Exudate  10%    Wound Length (cm)  1.2 cm    Wound Width (cm)  0.8 cm    Wound Surface Area (cm^2)  0.96 cm^2    Drainage Amount  Scant    Drainage Description  Serous    Treatment  Cleansed;Debridement (Selective)    Selective Debridement - Location  all wound beds    Selective Debridement - Tools Used  Forceps;Scalpel    Selective Debridement - Tissue Removed  slough     Wound Therapy - Clinical Statement  Please note that two new burns have been added to the Lt arm.  Due to them not being added on initial eval they show up first but are the last two distal burns on Pt Lt arm.  Pt stated that dressing was sliding down causing dressing to cut into her burns which caused a lot of irritation.  Pt had granddaughter who is a CNA change dressing.  Overall arms look better.  We are able to see the bottom of all but two wounds at this point.  Wounds depth vary from 0 to .5 cm .   Dressing changed as below.     Hydrotherapy Plan  Debridement;Dressing change;Patient/family education;Other (comment)   cleanse and moisturize    Wound Therapy - Frequency  2X / week    Wound Therapy - Current Recommendations  PT    Wound Plan  Continue to cleanse, moisturize, debride and dress pt wounds.  Continue with medihoney and xeroform as long as pt is responding well .     Dressing   medihoney followed by xeroform and 4x4 to all wounds.  Rt arm bandaged with 3"kling followed by #3 netting.  Lt arm had vaseline over arm due to dryness followed by dressing with kerlix and #3 netting.  No coban was used this session to see if this aids in pt's comfort level with dresssing.                PT Short Term Goals - 08/19/19 0934      PT SHORT TERM GOAL #1   Title  PT wounds to be 80 % granulated on both forearms    Time  4    Period  Weeks    Status  On-going    Target Date  09/09/19  PT SHORT TERM GOAL #2   Title  PT pain level to be no greater than a 2/10    Time  4    Period  Weeks    Status  On-going        PT Long Term Goals - 08/19/19 0934      PT LONG TERM GOAL #1   Title  All wounds to be healed on both forearms.    Time  6    Period  Weeks    Status  On-going            Plan - 08/19/19 0933    Clinical Impression Statement  see above    Personal Factors and Comorbidities  Time since onset of injury/illness/exacerbation    Examination-Activity Limitations  Dressing;Hygiene/Grooming    Examination-Participation Restrictions  Cleaning    Stability/Clinical Decision Making  Stable/Uncomplicated    Rehab Potential  Good    PT Frequency  2x / week    PT Duration  6 weeks    PT Treatment/Interventions  ADLs/Self Care Home Management;Other (comment)   debridement, dressing change   PT Next Visit Plan  see above       Patient will benefit from skilled therapeutic intervention in order to improve the following deficits and impairments:  Pain,  Decreased skin integrity  Visit Diagnosis: Partial thickness burn of right forearm, initial encounter  Pain in left forearm  Second degree burn of forearm, left, initial encounter     Problem List Patient Active Problem List   Diagnosis Date Noted  . S/P lumbar discectomy 03/01/2015  . 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 Low back pain 09/02/2013    Rayetta Humphrey, PT CLT [phone] 08/19/2019, 9:34 AM  [place] [address] [place], [place], [address] Phone: [phone]   Fax:  [phone]  Name: KENDALLYNN ALVARDO MRN: [id number] Date of Birth: [birth year]

## 2019-08-21 ENCOUNTER — Ambulatory Visit (HOSPITAL_COMMUNITY): Payer: Medicare Other | Admitting: Physical Therapy

## 2019-08-21 ENCOUNTER — Encounter (HOSPITAL_COMMUNITY): Payer: Self-pay | Admitting: Physical Therapy

## 2019-08-21 ENCOUNTER — Other Ambulatory Visit: Payer: Self-pay

## 2019-08-21 DIAGNOSIS — M79632 Pain in left forearm: Secondary | ICD-10-CM

## 2019-08-21 DIAGNOSIS — T22212A Burn of second degree of left forearm, initial encounter: Secondary | ICD-10-CM | POA: Diagnosis not present

## 2019-08-21 DIAGNOSIS — T22211A Burn of second degree of right forearm, initial encounter: Secondary | ICD-10-CM | POA: Diagnosis not present

## 2019-08-21 NOTE — Therapy (Signed)
[place], [place], [address] Phone: [phone]   Fax:  [phone]  Wound Care Therapy  Patient Details  Name: Lisa Griffith MRN: [id number] Date of Birth: [birth year] Referring Provider (PT): Sharilyn Sites    Encounter Date: 08/21/2019  PT End of Session - 08/21/19 0950    Visit Number  4    Number of Visits  16    Date for PT Re-Evaluation  10/11/19    PT Start Time  0905    PT Stop Time  0940    PT Time Calculation (min)  35 min    Activity Tolerance  Patient tolerated treatment well    Behavior During Therapy  [facility] for tasks assessed/performed       Past Medical History:  Diagnosis Date  . Back muscle spasm   . Constipation   . Coronary artery disease   . GERD (gastroesophageal reflux disease)   . Headache(784.0)   . Hypertension   . Ruptured lumbar disc   . Seasonal allergies   . Stroke [facility]) 2010   "mild"     Past Surgical History:  Procedure Laterality Date  . CARDIAC CATHETERIZATION  12/2008   [place]  . CATARACT EXTRACTION, BILATERAL  2008   [facility]  . COLONOSCOPY  01/16/2012   Procedure: COLONOSCOPY;  Surgeon: Jamesetta So, MD;  [facility];  Service: Gastroenterology;  Laterality: N/A;  . COLONOSCOPY    . EYE SURGERY Bilateral    cataract  . LUMBAR LAMINECTOMY/DECOMPRESSION MICRODISCECTOMY Right 03/01/2015   Procedure: Right L1 Hemilaminectomy, Foraminotomy;  Surgeon: Marybelle Killings, MD;  Location: [place];  Service: Orthopedics;  Laterality: Right;  . POSTERIOR LAMINECTOMY / DECOMPRESSION CERVICAL SPINE     [place]    There were no vitals filed for this visit.              Wound Therapy - 08/21/19 0942    Subjective  PT statest she no longer has any pain with her arms except when we are working on them.   States that the new dressing worked out very well, it did not slide and did not feel to tight.      Patient and Family Stated Goals  wounds to heal.      Date of Onset  02/09/19    Prior Treatments  antibiotic, and multiple creams.     Pain Scale  0-10    Pain Score  6    with debridement.    Pain Type  Acute pain    Pain Location  Arm    Pain Orientation  Left    Pain Descriptors / Indicators  Burning    Pain Intervention(s)  Emotional support    Evaluation and Treatment Procedures Explained to Patient/Family  Yes    Evaluation and Treatment Procedures  agreed to    Wound Properties Date First Assessed: 08/12/19 Time First Assessed: 0840 Wound Type: Burn Location: Arm Location Orientation: Right Wound Description (Comments): Rt forearm from elbow to wrist  has multiple wounds, (16+), scattered along dorsal aspect.  Most prominent is below.   Dressing Type  Gauze (Comment);Impregnated gauze (bismuth)   comes with no dressing on UE   Dressing Status  Intact;Old drainage    Dressing Change Frequency  PRN    Site / Wound Assessment  Yellow    % Wound base Red or Granulating  30%    % Wound base Yellow/Fibrinous Exudate  70%  Peri-wound Assessment  Intact    Drainage Amount  Minimal    Drainage Description  Purulent;No odor    Treatment  Cleansed;Debridement (Selective)    Wound Properties Date First Assessed: 08/12/19 Time First Assessed: 0900 Wound Type: Burn Location: Arm Location Orientation: Left Wound Description (Comments): multiple woundson LT forearm dorsal aspect from elbow to wrist; the most proximal  Present on Admission: Yes   Dressing Type  Gauze (Comment);Impregnated gauze (bismuth)    Dressing Changed  Changed    Dressing Status  Intact;Old drainage    Dressing Change Frequency  PRN    Site / Wound Assessment  Dry;Brown    % Wound base Red or Granulating  75%    % Wound base Yellow/Fibrinous Exudate  25%    Peri-wound Assessment  Intact    Drainage Amount  Minimal    Drainage Description  Serosanguineous    Wound Properties Date First Assessed: 08/12/19 Time First Assessed: 0900 Wound Type: Burn Location: Arm  Location Orientation: Left Wound Description (Comments): forearm; distal to 1st wound;  Present on Admission: Yes   Dressing Type  Gauze (Comment);Impregnated gauze (bismuth)    Dressing Status  Intact;Old drainage    Dressing Change Frequency  PRN    Site / Wound Assessment  Dry;Brown;Dusky    % Wound base Red or Granulating  40%    % Wound base Yellow/Fibrinous Exudate  60%    Peri-wound Assessment  Intact    Drainage Amount  Minimal    Drainage Description  Serous    Treatment  Cleansed;Debridement (Selective)    Wound Properties Date First Assessed: 08/12/19 Time First Assessed: 0921 Wound Type: Burn Location: Arm Location Orientation: Left Wound Description (Comments): third  main wound on forearm ,multiple other small areas involved    Dressing Type  Gauze (Comment);Impregnated gauze (bismuth)    Dressing Status  None    Dressing Change Frequency  PRN    Site / Wound Assessment  Brown;Dusky    % Wound base Red or Granulating  10%    % Wound base Yellow/Fibrinous Exudate  90%    Peri-wound Assessment  Intact    Drainage Amount  Minimal    Drainage Description  Serosanguineous    Treatment  Cleansed;Debridement (Selective)    Wound Properties Date First Assessed: 08/19/19 Time First Assessed: 0821 Wound Type: Burn Location: Arm Location Orientation: Left Wound Description (Comments): 4th wound going proximal to distal on Lt dorsal aspect of arm  Present on Admission: Yes   Dressing Type  Gauze (Comment);Impregnated gauze (bismuth)    Dressing Changed  Changed    Dressing Status  New drainage    Dressing Change Frequency  PRN    Site / Wound Assessment  Yellow    % Wound base Red or Granulating  5%    % Wound base Yellow/Fibrinous Exudate  95%    Peri-wound Assessment  Edema    Drainage Amount  Minimal    Drainage Description  Purulent    Treatment  Cleansed;Debridement (Selective)    Wound Properties Date First Assessed: 08/19/19 Time First Assessed: 0824 Wound Type: Burn  Location: Arm Location Orientation: Left Wound Description (Comments): most distal of wounds  Present on Admission: Yes   Dressing Type  Gauze (Comment);Impregnated gauze (bismuth)    Dressing Changed  Changed    Dressing Status  Old drainage    Dressing Change Frequency  PRN    Site / Wound Assessment  Granulation tissue;Yellow    % Wound base  Red or Granulating  95%    % Wound base Yellow/Fibrinous Exudate  5%    Drainage Amount  Scant    Drainage Description  Serous    Treatment  Cleansed;Debridement (Selective)    Selective Debridement - Location  all wound beds    Selective Debridement - Tools Used  Forceps;Scalpel;Scissors    Selective Debridement - Tissue Removed  slough and eschar    Wound Therapy - Clinical Statement  Please note that two new burns have been added to the Lt arm.  Due to them not being added on initial eval they show up first but are the last two distal burns on Pt Lt arm.  All wounds have improved granulation.  New dressing change was much more comfortable for pt.  Pt will continue to need skilled care for her burns until all slough and eschar has been removed.     Hydrotherapy Plan  Debridement;Dressing change;Patient/family education;Other (comment)   cleanse and moisturize    Wound Therapy - Frequency  2X / week    Wound Therapy - Current Recommendations  PT    Wound Plan  Continue to cleanse, moisturize, debride and dress pt wounds.  Continue with medihoney and xeroform as long as pt is responding well .     Dressing   medihoney followed by xeroform and 4x4 to all wounds.  Rt arm bandaged with 3"kling followed by #3 netting.  Lt arm had vaseline over arm due to dryness followed by dressing with kerlix and #3 netting.  No coban was used this session to see if this aids in pt's comfort level with dresssing.                PT Short Term Goals - 08/21/19 0951      PT SHORT TERM GOAL #1   Title  PT wounds to be 80 % granulated on both forearms    Time  4     Period  Weeks    Status  On-going    Target Date  09/09/19      PT SHORT TERM GOAL #2   Title  PT pain level to be no greater than a 2/10    Time  4    Period  Weeks    Status  Achieved        PT Long Term Goals - 08/21/19 [id number]      PT LONG TERM GOAL #1   Title  All wounds to be healed on both forearms.    Time  6    Period  Weeks    Status  On-going              Patient will benefit from skilled therapeutic intervention in order to improve the following deficits and impairments:     Visit Diagnosis: Partial thickness burn of right forearm, initial encounter  Pain in left forearm  Second degree burn of forearm, left, initial encounter     Problem List Patient Active Problem List   Diagnosis Date Noted  . S/P lumbar discectomy 03/01/2015  . 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 Low back pain 09/02/2013    Rayetta Humphrey, PT CLT [phone] 08/21/2019, 9:52 AM  [facility] [address] [place], [place], [address] Phone: [phone]   Fax:  [phone]  Name: VENORA HALL MRN: [id number] Date of Birth: 03-30-1946

## 2019-08-26 ENCOUNTER — Other Ambulatory Visit: Payer: Self-pay

## 2019-08-26 ENCOUNTER — Ambulatory Visit (HOSPITAL_COMMUNITY): Payer: Medicare Other | Admitting: Physical Therapy

## 2019-08-26 DIAGNOSIS — M79632 Pain in left forearm: Secondary | ICD-10-CM | POA: Diagnosis not present

## 2019-08-26 DIAGNOSIS — T22211A Burn of second degree of right forearm, initial encounter: Secondary | ICD-10-CM

## 2019-08-26 DIAGNOSIS — T22212A Burn of second degree of left forearm, initial encounter: Secondary | ICD-10-CM | POA: Diagnosis not present

## 2019-08-26 NOTE — Therapy (Signed)
[place], [place], [address] Phone: [phone]   Fax:  [phone]  Wound Care Therapy  Patient Details  Name: Lisa Griffith MRN: [id number] Date of Birth: 22-Feb-1946 Referring Provider (PT): Sharilyn Sites    Encounter Date: 08/26/2019  PT End of Session - 08/26/19 1003    Visit Number  5    Number of Visits  16    Date for PT Re-Evaluation  10/11/19    PT Start Time  0826    PT Stop Time  0900    PT Time Calculation (min)  34 min    Activity Tolerance  Patient tolerated treatment well    Behavior During Therapy  [facility] for tasks assessed/performed       Past Medical History:  Diagnosis Date  . Back muscle spasm   . Constipation   . Coronary artery disease   . GERD (gastroesophageal reflux disease)   . Headache(784.0)   . Hypertension   . Ruptured lumbar disc   . Seasonal allergies   . Stroke [facility]) 2010   "mild"     Past Surgical History:  Procedure Laterality Date  . CARDIAC CATHETERIZATION  12/2008   [place]  . CATARACT EXTRACTION, BILATERAL  2008   [facility]  . COLONOSCOPY  01/16/2012   Procedure: COLONOSCOPY;  Surgeon: Jamesetta So, MD;  [facility];  Service: Gastroenterology;  Laterality: N/A;  . COLONOSCOPY    . EYE SURGERY Bilateral    cataract  . LUMBAR LAMINECTOMY/DECOMPRESSION MICRODISCECTOMY Right 03/01/2015   Procedure: Right L1 Hemilaminectomy, Foraminotomy;  Surgeon: Marybelle Killings, MD;  Location: [place];  Service: Orthopedics;  Laterality: Right;  . POSTERIOR LAMINECTOMY / DECOMPRESSION CERVICAL SPINE     [place]    There were no vitals filed for this visit.              Wound Therapy - 08/26/19 0939    Subjective  pt states her arms are feeling much better.     Patient and Family Stated Goals  wounds to heal.     Date of Onset  02/09/19    Prior Treatments  antibiotic, and multiple creams.     Pain Scale  0-10    Pain Score  4     Pain Type  Acute  pain    Pain Location  Arm    Pain Orientation  Left    Pain Descriptors / Indicators  Burning    Evaluation and Treatment Procedures Explained to Patient/Family  Yes    Evaluation and Treatment Procedures  agreed to    Wound Properties Date First Assessed: 08/12/19 Time First Assessed: 0840 Wound Type: Burn Location: Arm Location Orientation: Right Wound Description (Comments): Rt forearm from elbow to wrist  has multiple wounds, (16+), scattered along dorsal aspect.  Most prominent is below.   Dressing Type  Gauze (Comment);Impregnated gauze (bismuth)    Dressing Changed  Changed    Dressing Status  Intact;Old drainage    Dressing Change Frequency  PRN    Site / Wound Assessment  Yellow    % Wound base Red or Granulating  65%    % Wound base Yellow/Fibrinous Exudate  35%    Peri-wound Assessment  Intact    Drainage Amount  Minimal    Drainage Description  Serosanguineous    Treatment  Cleansed;Debridement (Selective)    Wound Properties Date First Assessed: 08/12/19 Time First Assessed: 0900 Wound Type: Burn Location: Arm  Location Orientation: Left Wound Description (Comments): multiple woundson LT forearm dorsal aspect from elbow to wrist; the most proximal  Present on Admission: Yes   Dressing Type  Gauze (Comment);Impregnated gauze (bismuth)    Dressing Changed  Changed    Dressing Status  Intact;Old drainage    Dressing Change Frequency  PRN    Site / Wound Assessment  Dry;Brown    % Wound base Red or Granulating  95%    % Wound base Yellow/Fibrinous Exudate  5%    Peri-wound Assessment  Intact    Drainage Amount  Minimal    Drainage Description  Serous    Treatment  Cleansed    Wound Properties Date First Assessed: 08/12/19 Time First Assessed: 0900 Wound Type: Burn Location: Arm Location Orientation: Left Wound Description (Comments): forearm; distal to 1st wound;  Present on Admission: Yes   Dressing Type  Gauze (Comment);Impregnated gauze (bismuth)    Dressing Status   Intact;Old drainage    Dressing Change Frequency  PRN    Site / Wound Assessment  Dry;Brown;Dusky    % Wound base Red or Granulating  40%    % Wound base Yellow/Fibrinous Exudate  60%    Peri-wound Assessment  Intact    Drainage Amount  Minimal    Drainage Description  Serous    Wound Properties Date First Assessed: 08/12/19 Time First Assessed: 0921 Wound Type: Burn Location: Arm Location Orientation: Left Wound Description (Comments): third  main wound on forearm ,multiple other small areas involved    Dressing Type  Gauze (Comment);Impregnated gauze (bismuth)    Dressing Status  None    Dressing Change Frequency  PRN    Site / Wound Assessment  Brown;Dusky    % Wound base Red or Granulating  10%    % Wound base Yellow/Fibrinous Exudate  90%    Peri-wound Assessment  Intact    Drainage Amount  Minimal    Drainage Description  Serosanguineous    Treatment  Cleansed;Debridement (Selective)    Wound Properties Date First Assessed: 08/19/19 Time First Assessed: 0821 Wound Type: Burn Location: Arm Location Orientation: Left Wound Description (Comments): 4th wound going proximal to distal on Lt dorsal aspect of arm  Present on Admission: Yes   Dressing Type  Gauze (Comment);Impregnated gauze (bismuth)    Dressing Changed  Changed    Dressing Status  New drainage    Dressing Change Frequency  PRN    Site / Wound Assessment  Yellow    % Wound base Red or Granulating  5%    % Wound base Yellow/Fibrinous Exudate  95%    Peri-wound Assessment  Edema    Drainage Amount  Minimal    Drainage Description  Serosanguineous    Treatment  Cleansed;Debridement (Selective)    Wound Properties Date First Assessed: 08/19/19 Time First Assessed: 0824 Wound Type: Burn Location: Arm Location Orientation: Left Wound Description (Comments): most distal of wounds  Present on Admission: Yes Final Assessment Date: 08/26/19 Final Assessment Time: 0954   Dressing Type  --    Dressing Status  --    Dressing Change  Frequency  --    Site / Wound Assessment  --    % Wound base Red or Granulating  --    % Wound base Yellow/Fibrinous Exudate  --    Drainage Amount  --    Drainage Description  --    Selective Debridement - Location  all wound beds    Selective Debridement - Tools Used  Forceps;Scalpel;Scissors    Selective Debridement - Tissue Removed  slough and eschar    Wound Therapy - Clinical Statement  only 3 wounds remaining on Lt UE and one on Rt UE.  Wounds are progressing well with increasing granulation and approximation.  Less dryness of skin perimeter of wounds.  cleansed and debrided well via sharps.  Used xeroform on all wounds except most distal on lt LE using medihoney gel in this one due to slough.  Used small peice of medipore to secure each peice of gauze as all dressings had slid off previously.  Covered each UE with 3" conform and #3 netting to secure.      Hydrotherapy Plan  Debridement;Dressing change;Patient/family education;Other (comment)   cleanse and moisturize    Wound Therapy - Frequency  2X / week    Wound Therapy - Current Recommendations  PT    Wound Plan  Continue to cleanse, moisturize, debride and dress pt wounds.  Continue with medihoney and xeroform as long as pt is responding well .     Dressing   medihoney to most distal wound on Lt UE, xeroform on all others, gauze, 3" conform and #3 netting                PT Short Term Goals - 08/21/19 0951      PT SHORT TERM GOAL #1   Title  PT wounds to be 80 % granulated on both forearms    Time  4    Period  Weeks    Status  On-going    Target Date  09/09/19      PT SHORT TERM GOAL #2   Title  PT pain level to be no greater than a 2/10    Time  4    Period  Weeks    Status  Achieved        PT Long Term Goals - 08/21/19 [id number]      PT LONG TERM GOAL #1   Title  All wounds to be healed on both forearms.    Time  6    Period  Weeks    Status  On-going              Patient will benefit from  skilled therapeutic intervention in order to improve the following deficits and impairments:     Visit Diagnosis: Second degree burn of forearm, left, initial encounter  Partial thickness burn of right forearm, initial encounter  Pain in left forearm     Problem List Patient Active Problem List   Diagnosis Date Noted  . S/P lumbar discectomy 03/01/2015  . 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 Low back pain 09/02/2013   Teena Irani, PTA/CLT [phone]  Teena Irani 08/26/2019, 10:03 AM  [place], [place], [address] Phone: [phone]   Fax:  [phone]  Name: Lisa Griffith MRN: [id number] Date of Birth: [birth year]

## 2019-08-28 ENCOUNTER — Other Ambulatory Visit: Payer: Self-pay

## 2019-08-28 ENCOUNTER — Ambulatory Visit (HOSPITAL_COMMUNITY): Payer: Medicare Other | Attending: Family Medicine | Admitting: Physical Therapy

## 2019-08-28 DIAGNOSIS — M79632 Pain in left forearm: Secondary | ICD-10-CM

## 2019-08-28 DIAGNOSIS — T22211A Burn of second degree of right forearm, initial encounter: Secondary | ICD-10-CM | POA: Insufficient documentation

## 2019-08-28 DIAGNOSIS — T22212A Burn of second degree of left forearm, initial encounter: Secondary | ICD-10-CM | POA: Diagnosis not present

## 2019-08-28 NOTE — Therapy (Signed)
[place], [place], [address] Phone: [phone]   Fax:  [phone]  Wound Care Therapy  Patient Details  Name: Lisa Griffith MRN: [id number] Date of Birth: October 15, 1946 Referring Provider (PT): Sharilyn Sites    Encounter Date: 08/28/2019  PT End of Session - 08/28/19 1045    Visit Number  6    Number of Visits  16    Date for PT Re-Evaluation  10/11/19    PT Start Time  0830    PT Stop Time  0905    PT Time Calculation (min)  35 min    Activity Tolerance  Patient tolerated treatment well    Behavior During Therapy  [facility] for tasks assessed/performed       Past Medical History:  Diagnosis Date  . Back muscle spasm   . Constipation   . Coronary artery disease   . GERD (gastroesophageal reflux disease)   . Headache(784.0)   . Hypertension   . Ruptured lumbar disc   . Seasonal allergies   . Stroke [facility]) 2010   "mild"     Past Surgical History:  Procedure Laterality Date  . CARDIAC CATHETERIZATION  12/2008   [place]  . CATARACT EXTRACTION, BILATERAL  2008   [facility]  . COLONOSCOPY  01/16/2012   Procedure: COLONOSCOPY;  Surgeon: Jamesetta So, MD;  [facility];  Service: Gastroenterology;  Laterality: N/A;  . COLONOSCOPY    . EYE SURGERY Bilateral    cataract  . LUMBAR LAMINECTOMY/DECOMPRESSION MICRODISCECTOMY Right 03/01/2015   Procedure: Right L1 Hemilaminectomy, Foraminotomy;  Surgeon: Marybelle Killings, MD;  Location: [place];  Service: Orthopedics;  Laterality: Right;  . POSTERIOR LAMINECTOMY / DECOMPRESSION CERVICAL SPINE     [place]    There were no vitals filed for this visit.              Wound Therapy - 08/28/19 1025    Subjective  pt states her Lt arm got a little itchy but overall 100 percent better.    Patient and Family Stated Goals  wounds to heal.     Date of Onset  02/09/19    Prior Treatments  antibiotic, and multiple creams.     Pain Scale  0-10    Pain  Score  0-No pain    Evaluation and Treatment Procedures Explained to Patient/Family  Yes    Evaluation and Treatment Procedures  agreed to    Wound Properties Date First Assessed: 08/12/19 Time First Assessed: 0840 Wound Type: Burn Location: Arm Location Orientation: Right Wound Description (Comments): Rt forearm from elbow to wrist  has multiple wounds, (16+), scattered along dorsal aspect.  Most prominent is below.   Dressing Type  Gauze (Comment);Impregnated gauze (bismuth)    Dressing Status  Intact;Old drainage    Dressing Change Frequency  PRN    Site / Wound Assessment  Yellow    % Wound base Red or Granulating  90%    % Wound base Yellow/Fibrinous Exudate  10%    Peri-wound Assessment  Intact    Wound Length (cm)  0.3 cm    Wound Width (cm)  0.2 cm    Wound Depth (cm)  0.1 cm    Wound Volume (cm^3)  0.01 cm^3    Wound Surface Area (cm^2)  0.06 cm^2    Drainage Amount  Scant    Drainage Description  Serous    Treatment  Cleansed;Debridement (Selective)    Wound Properties  Date First Assessed: 08/12/19 Time First Assessed: 0900 Wound Type: Burn Location: Arm Location Orientation: Left Wound Description (Comments): multiple woundson LT forearm dorsal aspect from elbow to wrist; the most proximal  Present on Admission: Yes   Dressing Type  Gauze (Comment);Impregnated gauze (bismuth)    Dressing Changed  Changed    Dressing Status  Intact;Old drainage    Dressing Change Frequency  PRN    Site / Wound Assessment  Dry;Brown    % Wound base Red or Granulating  100%    % Wound base Yellow/Fibrinous Exudate  0%    Peri-wound Assessment  Intact    Wound Length (cm)  1 cm    Wound Width (cm)  0.7 cm    Wound Depth (cm)  0.1 cm    Wound Volume (cm^3)  0.07 cm^3    Wound Surface Area (cm^2)  0.7 cm^2    Drainage Amount  Scant    Drainage Description  Serous    Treatment  Cleansed    Wound Properties Date First Assessed: 08/12/19 Time First Assessed: 0900 Wound Type: Burn Location: Arm  Location Orientation: Left Wound Description (Comments): forearm; distal to 1st wound;  Present on Admission: Yes   Dressing Type  Gauze (Comment);Impregnated gauze (bismuth)    Dressing Status  Intact;Old drainage    Dressing Change Frequency  PRN    Site / Wound Assessment  Dry;Brown;Dusky    % Wound base Red or Granulating  95%    % Wound base Yellow/Fibrinous Exudate  5%    Peri-wound Assessment  Intact    Wound Length (cm)  0.8 cm    Wound Width (cm)  2 cm    Wound Depth (cm)  0.1 cm    Wound Volume (cm^3)  0.16 cm^3    Wound Surface Area (cm^2)  1.6 cm^2    Drainage Amount  Scant    Drainage Description  Serous    Treatment  Cleansed;Debridement (Selective)    Wound Properties Date First Assessed: 08/12/19 Time First Assessed: 0921 Wound Type: Burn Location: Arm Location Orientation: Left Wound Description (Comments): third  main wound on forearm ,multiple other small areas involved    Dressing Type  Gauze (Comment);Impregnated gauze (bismuth)    Dressing Changed  Changed    Dressing Status  None    Dressing Change Frequency  PRN    Site / Wound Assessment  Brown;Dusky    % Wound base Red or Granulating  10%    % Wound base Yellow/Fibrinous Exudate  90%    Peri-wound Assessment  Intact    Wound Length (cm)  0.8 cm    Wound Width (cm)  0.8 cm    Wound Depth (cm)  0.2 cm    Wound Volume (cm^3)  0.13 cm^3    Wound Surface Area (cm^2)  0.64 cm^2    Drainage Amount  Scant    Drainage Description  Serous    Treatment  Cleansed;Debridement (Selective)    Wound Properties Date First Assessed: 08/19/19 Time First Assessed: 0821 Wound Type: Burn Location: Arm Location Orientation: Left Wound Description (Comments): 4th wound going proximal to distal on Lt dorsal aspect of arm  Present on Admission: Yes Final Assessment Date: 08/28/19 Final Assessment Time: 0845   Dressing Type  --  (Pended)     Dressing Status  --  (Pended)     Dressing Change Frequency  --  (Pended)     Site / Wound  Assessment  --  (Pended)     %  Wound base Red or Granulating  --  (Pended)     % Wound base Yellow/Fibrinous Exudate  --  (Pended)     Peri-wound Assessment  --  (Pended)     Drainage Amount  --  (Pended)     Drainage Description  --  (Pended)     Selective Debridement - Location  all wound beds    Selective Debridement - Tools Used  Forceps;Scalpel;Scissors    Selective Debridement - Tissue Removed  slough and eschar    Wound Therapy - Clinical Statement  pt with only one small wound on Rt (decreased from .56cm2 surface area to 0.06cm2) and 3 wounds on Lt UE.  Most proximal wound and second wound are granulated well and approximating.  reduced surface area 3-0.7cm3 for most proximal and 8-1.6 cm2 for second one.  Most distal wound remains mostly adhered with slough.  Able to scrape large amount from wound bed and encourage edges to approximate.  continued with medihoney in this one and all others applied xeroform.  Anticipate full healing by next couple of weeks.    Hydrotherapy Plan  Debridement;Dressing change;Patient/family education;Other (comment)   cleanse and moisturize    Wound Therapy - Frequency  2X / week    Wound Therapy - Current Recommendations  PT    Wound Plan  Continue to cleanse, moisturize, debride and dress pt wounds.  Continue with medihoney and xeroform as long as pt is responding well .     Dressing   medihoney to most distal wound on Lt UE, xeroform on all others, gauze, 3" conform and #3 netting                PT Short Term Goals - 08/21/19 0951      PT SHORT TERM GOAL #1   Title  PT wounds to be 80 % granulated on both forearms    Time  4    Period  Weeks    Status  On-going    Target Date  09/09/19      PT SHORT TERM GOAL #2   Title  PT pain level to be no greater than a 2/10    Time  4    Period  Weeks    Status  Achieved        PT Long Term Goals - 08/21/19 [id number]      PT LONG TERM GOAL #1   Title  All wounds to be healed on both forearms.     Time  6    Period  Weeks    Status  On-going              Patient will benefit from skilled therapeutic intervention in order to improve the following deficits and impairments:     Visit Diagnosis: Pain in left forearm  Second degree burn of forearm, left, initial encounter  Partial thickness burn of right forearm, initial encounter     Problem List Patient Active Problem List   Diagnosis Date Noted  . S/P lumbar discectomy 03/01/2015  . 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 Low back pain 09/02/2013   Teena Irani, PTA/CLT [phone]  Teena Irani 08/28/2019, 10:49 AM  [place] [address] [place], [place], [address] Phone: [phone]   Fax:  [phone]  Name: Lisa Griffith MRN: [id number] Date of Birth: [birth year]

## 2019-09-02 ENCOUNTER — Other Ambulatory Visit: Payer: Self-pay

## 2019-09-02 ENCOUNTER — Ambulatory Visit (HOSPITAL_COMMUNITY): Payer: Medicare Other | Admitting: Physical Therapy

## 2019-09-02 DIAGNOSIS — T22212A Burn of second degree of left forearm, initial encounter: Secondary | ICD-10-CM | POA: Diagnosis not present

## 2019-09-02 DIAGNOSIS — T22211A Burn of second degree of right forearm, initial encounter: Secondary | ICD-10-CM | POA: Diagnosis not present

## 2019-09-02 DIAGNOSIS — M79632 Pain in left forearm: Secondary | ICD-10-CM

## 2019-09-02 NOTE — Therapy (Signed)
[place], [place], [address] Phone: [phone]   Fax:  [phone]  Wound Care Therapy  Patient Details  Name: Lisa Griffith MRN: [id number] Date of Birth: [birth year] Referring Provider (PT): Sharilyn Sites    Encounter Date: 09/02/2019  PT End of Session - 09/02/19 1003    Visit Number  7    Number of Visits  16    Date for PT Re-Evaluation  10/11/19    PT Start Time  0830    PT Stop Time  0915    PT Time Calculation (min)  45 min    Activity Tolerance  Patient tolerated treatment well    Behavior During Therapy  [facility] for tasks assessed/performed       Past Medical History:  Diagnosis Date  . Back muscle spasm   . Constipation   . Coronary artery disease   . GERD (gastroesophageal reflux disease)   . Headache(784.0)   . Hypertension   . Ruptured lumbar disc   . Seasonal allergies   . Stroke [facility]) 2010   "mild"     Past Surgical History:  Procedure Laterality Date  . CARDIAC CATHETERIZATION  12/2008   [place]  . CATARACT EXTRACTION, BILATERAL  2008   [facility]  . COLONOSCOPY  01/16/2012   Procedure: COLONOSCOPY;  Surgeon: Jamesetta So, MD;  [facility];  Service: Gastroenterology;  Laterality: N/A;  . COLONOSCOPY    . EYE SURGERY Bilateral    cataract  . LUMBAR LAMINECTOMY/DECOMPRESSION MICRODISCECTOMY Right 03/01/2015   Procedure: Right L1 Hemilaminectomy, Foraminotomy;  Surgeon: Marybelle Killings, MD;  Location: [place];  Service: Orthopedics;  Laterality: Right;  . POSTERIOR LAMINECTOMY / DECOMPRESSION CERVICAL SPINE     [place]    There were no vitals filed for this visit.              Wound Therapy - 09/02/19 0942    Subjective  pt accompanied by her daughter this session.   States she changed it a couple times because it got wet    Patient and Family Stated Goals  wounds to heal.     Date of Onset  02/09/19    Prior Treatments  antibiotic, and multiple creams.      Pain Scale  0-10    Pain Score  0-No pain    Evaluation and Treatment Procedures Explained to Patient/Family  Yes    Evaluation and Treatment Procedures  agreed to    Wound Properties Date First Assessed: 08/12/19 Time First Assessed: 0840 Wound Type: Burn Location: Arm Location Orientation: Right Wound Description (Comments): Rt forearm from elbow to wrist  has multiple wounds, (16+), scattered along dorsal aspect.  Most prominent is below. Final Assessment Date: 09/02/19 Final Assessment Time: 0845   Dressing Type  --  (Pended)     Dressing Status  --  (Pended)     Dressing Change Frequency  --  (Pended)     Site / Wound Assessment  --  (Pended)     % Wound base Red or Granulating  --  (Pended)     % Wound base Yellow/Fibrinous Exudate  --  (Pended)     Peri-wound Assessment  --  (Pended)     Drainage Amount  Scant  (Pended)     Drainage Description  Serous  (Pended)     Wound Properties Date First Assessed: 08/12/19 Time First Assessed: 0900 Wound Type: Burn Location: Arm Location Orientation: Left  Wound Description (Comments): multiple woundson LT forearm dorsal aspect from elbow to wrist; the most proximal  Present on Admission: Yes   Dressing Type  Gauze (Comment);Impregnated gauze (bismuth)    Dressing Status  Intact;Old drainage    Dressing Change Frequency  PRN    Site / Wound Assessment  Dry;Brown    % Wound base Red or Granulating  100%    % Wound base Yellow/Fibrinous Exudate  0%    Peri-wound Assessment  Intact    Wound Length (cm)  0.2 cm    Wound Width (cm)  0.1 cm    Wound Depth (cm)  0.1 cm    Wound Volume (cm^3)  0 cm^3    Wound Surface Area (cm^2)  0.02 cm^2    Drainage Amount  Scant    Drainage Description  Serous    Treatment  Cleansed    Wound Properties Date First Assessed: 08/12/19 Time First Assessed: 0900 Wound Type: Burn Location: Arm Location Orientation: Left Wound Description (Comments): forearm; distal to 1st wound;  Present on Admission: Yes    Dressing Type  Gauze (Comment);Impregnated gauze (bismuth)    Dressing Changed  Changed    Dressing Status  Intact;Old drainage    Dressing Change Frequency  PRN    Site / Wound Assessment  Dry;Brown;Dusky    % Wound base Red or Granulating  100%    % Wound base Yellow/Fibrinous Exudate  0%    Peri-wound Assessment  Intact    Wound Length (cm)  0.4 cm    Wound Width (cm)  0.6 cm    Wound Depth (cm)  0.1 cm    Wound Volume (cm^3)  0.02 cm^3    Wound Surface Area (cm^2)  0.24 cm^2    Drainage Amount  Scant    Drainage Description  Serous    Treatment  Cleansed;Debridement (Selective)    Wound Properties Date First Assessed: 08/12/19 Time First Assessed: 0921 Wound Type: Burn Location: Arm Location Orientation: Left Wound Description (Comments): third  main wound on forearm ,multiple other small areas involved    Dressing Type  Gauze (Comment);Impregnated gauze (bismuth)    Dressing Changed  Changed    Dressing Status  None    Dressing Change Frequency  PRN    Site / Wound Assessment  Brown;Dusky    % Wound base Red or Granulating  95%    % Wound base Yellow/Fibrinous Exudate  10%    Peri-wound Assessment  Intact    Wound Length (cm)  0.4 cm    Wound Width (cm)  0.4 cm    Wound Depth (cm)  0.1 cm    Wound Volume (cm^3)  0.02 cm^3    Wound Surface Area (cm^2)  0.16 cm^2    Drainage Amount  Scant    Drainage Description  Serous    Treatment  Cleansed;Debridement (Selective)    Selective Debridement - Location  distal 2 wounds Lt UE    Selective Debridement - Tools Used  Forceps    Selective Debridement - Tissue Removed  slough and eschar    Wound Therapy - Clinical Statement  wounds continue to improve with near full closure of most proximal on Lt UE and full closure on Rt UE.  Bottom two wounds further debrided with drastically improved granulation following.  Daugther with pateint this morning and agreed to follow up 1-2 more times then would be ready for discharge.  Did not place  dressing on Rt UE or most proximal wound Lt  LE.  Remaining 2 covered with xerform, gauze and conform.     Hydrotherapy Plan  Debridement;Dressing change;Patient/family education;Other (comment)   cleanse and moisturize    Wound Therapy - Frequency  2X / week    Wound Therapy - Current Recommendations  PT    Wound Plan  Continue to cleanse, moisturize, debride and dress pt wounds.  Continue with appropriate dressing.     Dressing   xerform to both remaining wounds Lt UE,  gauze, 3" conform and #3 netting                PT Short Term Goals - 08/21/19 0951      PT SHORT TERM GOAL #1   Title  PT wounds to be 80 % granulated on both forearms    Time  4    Period  Weeks    Status  On-going    Target Date  09/09/19      PT SHORT TERM GOAL #2   Title  PT pain level to be no greater than a 2/10    Time  4    Period  Weeks    Status  Achieved        PT Long Term Goals - 08/21/19 [id number]      PT LONG TERM GOAL #1   Title  All wounds to be healed on both forearms.    Time  6    Period  Weeks    Status  On-going              Patient will benefit from skilled therapeutic intervention in order to improve the following deficits and impairments:     Visit Diagnosis: Second degree burn of forearm, left, initial encounter  Partial thickness burn of right forearm, initial encounter  Pain in left forearm     Problem List Patient Active Problem List   Diagnosis Date Noted  . S/P lumbar discectomy 03/01/2015  . 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 Low back pain 09/02/2013   Teena Irani, PTA/CLT [phone]  Teena Irani 09/02/2019, 10:04 AM  [place], [place], [address] Phone: [phone]   Fax:  [phone]  Name: ANY IZBICKI MRN: [id number] Date of Birth: [birth year]

## 2019-09-04 ENCOUNTER — Ambulatory Visit (HOSPITAL_COMMUNITY): Payer: Medicare Other | Admitting: Physical Therapy

## 2019-09-04 ENCOUNTER — Telehealth (HOSPITAL_COMMUNITY): Payer: Self-pay | Admitting: Physical Therapy

## 2019-09-04 NOTE — Telephone Encounter (Signed)
Pt did not show for appointment.  Called and spoke to pt and states she did not push the button on the phone tree reminder call yesterday thinking that would cancel her appointment.    Pt states she had transportation issues this morning but her daugther is going to change her wound dressing tomorrow and it is looking so much better.  Reminded of next appt on Tuesday of next week.  Teena Irani, PTA/CLT [phone]

## 2019-09-09 ENCOUNTER — Telehealth (HOSPITAL_COMMUNITY): Payer: Self-pay | Admitting: Occupational Therapy

## 2019-09-09 ENCOUNTER — Other Ambulatory Visit: Payer: Self-pay

## 2019-09-09 ENCOUNTER — Ambulatory Visit (HOSPITAL_COMMUNITY): Payer: Medicare Other | Admitting: Physical Therapy

## 2019-09-09 DIAGNOSIS — T22211A Burn of second degree of right forearm, initial encounter: Secondary | ICD-10-CM

## 2019-09-09 DIAGNOSIS — M79632 Pain in left forearm: Secondary | ICD-10-CM

## 2019-09-09 DIAGNOSIS — T22212A Burn of second degree of left forearm, initial encounter: Secondary | ICD-10-CM | POA: Diagnosis not present

## 2019-09-09 NOTE — Therapy (Addendum)
[place], [place], [address] Phone: [phone]   Fax:  [phone]  Wound Care Therapy  Patient Details  Name: Lisa Griffith MRN: [id number] Date of Birth: 04-01-1946 Referring Provider (PT): Sharilyn Sites   PHYSICAL THERAPY DISCHARGE SUMMARY  Visits from Start of Care: 8  Current functional level related to goals / functional outcomes: See below   Remaining deficits: Small wound that pt can care for herself    Education / Equipment: Self care  Plan: Patient agrees to discharge.  Patient goals were met. Patient is being discharged due to meeting the stated rehab goals.  ?????        Encounter Date: 09/09/2019  PT End of Session - 09/09/19 0917    Visit Number  8    Number of Visits  16    Date for PT Re-Evaluation  10/11/19    PT Start Time  0820    PT Stop Time  0835    PT Time Calculation (min)  15 min    Activity Tolerance  Patient tolerated treatment well    Behavior During Therapy  [facility] for tasks assessed/performed       Past Medical History:  Diagnosis Date  . Back muscle spasm   . Constipation   . Coronary artery disease   . GERD (gastroesophageal reflux disease)   . Headache(784.0)   . Hypertension   . Ruptured lumbar disc   . Seasonal allergies   . Stroke [facility]) 2010   "mild"     Past Surgical History:  Procedure Laterality Date  . CARDIAC CATHETERIZATION  12/2008   [place]  . CATARACT EXTRACTION, BILATERAL  2008   [facility]  . COLONOSCOPY  01/16/2012   Procedure: COLONOSCOPY;  Surgeon: Jamesetta So, MD;  [facility];  Service: Gastroenterology;  Laterality: N/A;  . COLONOSCOPY    . EYE SURGERY Bilateral    cataract  . LUMBAR LAMINECTOMY/DECOMPRESSION MICRODISCECTOMY Right 03/01/2015   Procedure: Right L1 Hemilaminectomy, Foraminotomy;  Surgeon: Marybelle Killings, MD;  Location: [place];  Service: Orthopedics;  Laterality: Right;  . POSTERIOR LAMINECTOMY /  DECOMPRESSION CERVICAL SPINE     [place]    There were no vitals filed for this visit.              Wound Therapy - 09/09/19 0912    Subjective  pt states she left the bandaging on and thinks it is healed now.    Patient and Family Stated Goals  wounds to heal.     Date of Onset  02/09/19    Prior Treatments  antibiotic, and multiple creams.     Evaluation and Treatment Procedures Explained to Patient/Family  Yes    Evaluation and Treatment Procedures  agreed to    Wound Properties Date First Assessed: 08/12/19 Time First Assessed: 0900 Wound Type: Burn Location: Arm Location Orientation: Left Wound Description (Comments): multiple woundson LT forearm dorsal aspect from elbow to wrist; the most proximal  Present on Admission: Yes Final Assessment Date: 09/09/19 Final Assessment Time: 0913   Wound Properties Date First Assessed: 08/12/19 Time First Assessed: 0900 Wound Type: Burn Location: Arm Location Orientation: Left Wound Description (Comments): forearm; distal to 1st wound;  Present on Admission: Yes Final Assessment Date: 09/09/19 Final Assessment Time: 0913   Dressing Type  --    Dressing Status  --    Dressing Change Frequency  --    Site / Wound Assessment  --    %  Wound base Red or Granulating  --    % Wound base Yellow/Fibrinous Exudate  --    Peri-wound Assessment  --    Drainage Amount  --    Drainage Description  --    Wound Properties Date First Assessed: 08/12/19 Time First Assessed: 0921 Wound Type: Burn Location: Arm Location Orientation: Left Wound Description (Comments): third  main wound on forearm ,multiple other small areas involved    Dressing Type  Gauze (Comment);Impregnated gauze (bismuth)    Dressing Changed  Changed    Dressing Status  None    Dressing Change Frequency  PRN    Site / Wound Assessment  Brown;Dusky    % Wound base Red or Granulating  100%    % Wound base Yellow/Fibrinous Exudate  0%    Peri-wound Assessment  Intact    Wound Length  (cm)  0.2 cm    Wound Width (cm)  0.2 cm    Wound Depth (cm)  0 cm    Wound Volume (cm^3)  0 cm^3    Wound Surface Area (cm^2)  0.04 cm^2    Drainage Amount  None    Drainage Description  --    Treatment  Cleansed;Debridement (Selective)    Selective Debridement - Location  most distal wound    Selective Debridement - Tools Used  Forceps    Selective Debridement - Tissue Removed  slough     Wound Therapy - Clinical Statement  All wounds now healed with only slight opening remaining most distal on Lt UE.  Cleansed wound well, debrided remaining slough and instructed to continue self care for this one at home using xeroform and bandage.  pt verbalized understanding.  Pt no longer requires skilled services.      Hydrotherapy Plan  --    Wound Therapy - Frequency  --    Wound Therapy - Current Recommendations  --    Wound Plan  discharge to self care    Dressing   --                PT Short Term Goals - 09/09/19 0916      PT SHORT TERM GOAL #1   Title  PT wounds to be 80 % granulated on both forearms    Time  4    Period  Weeks    Status  Achieved    Target Date  09/09/19      PT SHORT TERM GOAL #2   Title  PT pain level to be no greater than a 2/10    Time  4    Period  Weeks    Status  Achieved        PT Long Term Goals - 09/09/19 5462      PT LONG TERM GOAL #1   Title  All wounds to be healed on both forearms.    Time  6    Period  Weeks    Status  Partially Met              Patient will benefit from skilled therapeutic intervention in order to improve the following deficits and impairments:     Visit Diagnosis: Second degree burn of forearm, left, initial encounter  Partial thickness burn of right forearm, initial encounter  Pain in left forearm     Problem List Patient Active Problem List   Diagnosis Date Noted  . S/P lumbar discectomy 03/01/2015  . 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 Low back pain 09/02/2013  Teena Irani,  PTA/CLT [place], PT CLT [phone] 09/09/2019, 9:19 AM  [place] [address] [place], [place], [address] Phone: [phone]   Fax:  [phone]  Name: MARGUERITE BARBA MRN: [id number] Date of Birth: Jun 08, 1946

## 2019-09-11 ENCOUNTER — Ambulatory Visit (HOSPITAL_COMMUNITY): Payer: Medicare Other | Admitting: Physical Therapy

## 2019-09-23 DIAGNOSIS — M7061 Trochanteric bursitis, right hip: Secondary | ICD-10-CM | POA: Diagnosis not present

## 2019-09-23 DIAGNOSIS — M5416 Radiculopathy, lumbar region: Secondary | ICD-10-CM | POA: Diagnosis not present

## 2019-09-23 DIAGNOSIS — M545 Low back pain: Secondary | ICD-10-CM | POA: Diagnosis not present

## 2019-09-25 DIAGNOSIS — R7309 Other abnormal glucose: Secondary | ICD-10-CM | POA: Diagnosis not present

## 2019-10-27 DIAGNOSIS — E7849 Other hyperlipidemia: Secondary | ICD-10-CM | POA: Diagnosis not present

## 2019-10-27 DIAGNOSIS — I2510 Atherosclerotic heart disease of native coronary artery without angina pectoris: Secondary | ICD-10-CM | POA: Diagnosis not present

## 2019-10-27 DIAGNOSIS — I10 Essential (primary) hypertension: Secondary | ICD-10-CM | POA: Diagnosis not present

## 2019-10-31 DIAGNOSIS — E7849 Other hyperlipidemia: Secondary | ICD-10-CM | POA: Diagnosis not present

## 2019-10-31 DIAGNOSIS — R7309 Other abnormal glucose: Secondary | ICD-10-CM | POA: Diagnosis not present

## 2019-10-31 DIAGNOSIS — E119 Type 2 diabetes mellitus without complications: Secondary | ICD-10-CM | POA: Diagnosis not present

## 2019-10-31 DIAGNOSIS — Z23 Encounter for immunization: Secondary | ICD-10-CM | POA: Diagnosis not present

## 2019-10-31 DIAGNOSIS — I2510 Atherosclerotic heart disease of native coronary artery without angina pectoris: Secondary | ICD-10-CM | POA: Diagnosis not present

## 2019-10-31 DIAGNOSIS — I10 Essential (primary) hypertension: Secondary | ICD-10-CM | POA: Diagnosis not present

## 2019-12-28 DIAGNOSIS — I2510 Atherosclerotic heart disease of native coronary artery without angina pectoris: Secondary | ICD-10-CM | POA: Diagnosis not present

## 2019-12-28 DIAGNOSIS — I10 Essential (primary) hypertension: Secondary | ICD-10-CM | POA: Diagnosis not present

## 2019-12-28 DIAGNOSIS — E7849 Other hyperlipidemia: Secondary | ICD-10-CM | POA: Diagnosis not present

## 2020-01-25 DIAGNOSIS — I2510 Atherosclerotic heart disease of native coronary artery without angina pectoris: Secondary | ICD-10-CM | POA: Diagnosis not present

## 2020-01-25 DIAGNOSIS — I10 Essential (primary) hypertension: Secondary | ICD-10-CM | POA: Diagnosis not present

## 2020-01-25 DIAGNOSIS — E7849 Other hyperlipidemia: Secondary | ICD-10-CM | POA: Diagnosis not present

## 2020-02-25 DIAGNOSIS — I2510 Atherosclerotic heart disease of native coronary artery without angina pectoris: Secondary | ICD-10-CM | POA: Diagnosis not present

## 2020-02-25 DIAGNOSIS — I10 Essential (primary) hypertension: Secondary | ICD-10-CM | POA: Diagnosis not present

## 2020-02-25 DIAGNOSIS — E7849 Other hyperlipidemia: Secondary | ICD-10-CM | POA: Diagnosis not present

## 2020-03-04 DIAGNOSIS — E7849 Other hyperlipidemia: Secondary | ICD-10-CM | POA: Diagnosis not present

## 2020-03-04 DIAGNOSIS — E119 Type 2 diabetes mellitus without complications: Secondary | ICD-10-CM | POA: Diagnosis not present

## 2020-03-04 DIAGNOSIS — Z1389 Encounter for screening for other disorder: Secondary | ICD-10-CM | POA: Diagnosis not present

## 2020-03-04 DIAGNOSIS — I10 Essential (primary) hypertension: Secondary | ICD-10-CM | POA: Diagnosis not present

## 2020-03-26 DIAGNOSIS — I10 Essential (primary) hypertension: Secondary | ICD-10-CM | POA: Diagnosis not present

## 2020-03-26 DIAGNOSIS — E7849 Other hyperlipidemia: Secondary | ICD-10-CM | POA: Diagnosis not present

## 2020-03-26 DIAGNOSIS — I2510 Atherosclerotic heart disease of native coronary artery without angina pectoris: Secondary | ICD-10-CM | POA: Diagnosis not present

## 2020-03-26 DIAGNOSIS — Z720 Tobacco use: Secondary | ICD-10-CM | POA: Diagnosis not present

## 2020-04-26 DIAGNOSIS — E7849 Other hyperlipidemia: Secondary | ICD-10-CM | POA: Diagnosis not present

## 2020-04-26 DIAGNOSIS — I10 Essential (primary) hypertension: Secondary | ICD-10-CM | POA: Diagnosis not present

## 2020-04-26 DIAGNOSIS — R7309 Other abnormal glucose: Secondary | ICD-10-CM | POA: Diagnosis not present

## 2020-04-26 DIAGNOSIS — Z8673 Personal history of transient ischemic attack (TIA), and cerebral infarction without residual deficits: Secondary | ICD-10-CM | POA: Diagnosis not present

## 2020-05-20 ENCOUNTER — Ambulatory Visit: Payer: Self-pay

## 2020-05-20 ENCOUNTER — Ambulatory Visit (INDEPENDENT_AMBULATORY_CARE_PROVIDER_SITE_OTHER): Payer: Medicare Other | Admitting: Orthopaedic Surgery

## 2020-05-20 ENCOUNTER — Encounter: Payer: Self-pay | Admitting: Orthopaedic Surgery

## 2020-05-20 ENCOUNTER — Other Ambulatory Visit: Payer: Self-pay

## 2020-05-20 VITALS — BP 167/73 | HR 73 | Ht 62.0 in | Wt 180.0 lb

## 2020-05-20 DIAGNOSIS — M542 Cervicalgia: Secondary | ICD-10-CM

## 2020-05-24 NOTE — Progress Notes (Signed)
Office Visit Note   Patient: Lisa Griffith           Date of Birth: 05/08/1946           MRN: [id number] Visit Date: 05/20/2020              Requested by: Sharilyn Sites, [address],  [place] [address] PCP: Sharilyn Sites, MD   Assessment & Plan: Visit Diagnoses:  1. Neck pain     Plan: Patient has solid cervical fusion.  She has degenerative changes above and below the fusion.  She has recurrent symptoms she will let us know.  Follow-Up Instructions: No follow-ups on file.   Orders:  Orders Placed This Encounter  Procedures  . XR Cervical Spine 2 or 3 views   No orders of the defined types were placed in this encounter.     Procedures: No procedures performed   Clinical Data: No additional findings.   Subjective: Chief Complaint  Patient presents with  . Neck - Pain    HPI 74 year old female states last Thursday she had severe neck pain that was excruciating she could not turn her neck had trouble turning side to side.  She used ice Tylenol TENS unit.  She denied numbness or tingling.  She had previous lumbar surgery by me in 2016 which is doing well.  Previous three level cervical fusion C4-C7 with cage and plate by Dr. Ellene Route.  X-rays today show disc fusions completely healed.  Patient does have problems with diabetes and hypertension.  No lower extremity problems no gait disturbance no problems with stairs no chills or fever.  No problems swallowing.  Review of Systems noncontributory as it pertains to HPI.   Objective: Vital Signs: BP (!) 167/73   Pulse 73   Ht 5\' 2"  (1.575 m)   Wt 180 lb (81.6 kg)   BMI 32.92 kg/m   Physical Exam Constitutional:      Appearance: She is well-developed.  HENT:     Head: Normocephalic.     Right Ear: External ear normal.     Left Ear: External ear normal.  Eyes:     Pupils: Pupils are equal, round, and reactive to light.  Neck:     Thyroid: No thyromegaly.     Trachea: No tracheal  deviation.  Cardiovascular:     Rate and Rhythm: Normal rate.  Pulmonary:     Effort: Pulmonary effort is normal.  Abdominal:     Palpations: Abdomen is soft.  Skin:    General: Skin is warm and dry.  Neurological:     Mental Status: She is alert and oriented to person, place, and time.  Psychiatric:        Behavior: Behavior normal.     Ortho Exam patient has intact reflexes.  Mild brachial plexus tenderness.  20% limitation of rotation right and left negative Sp[place]ling.  Normal lower extremity reflexes.  Some discomfort with forward flexion and extension.  Specialty Comments:  No specialty comments available.  Imaging: No results found.   PMFS History: Patient Active Problem List   Diagnosis Date Noted  . S/P lumbar discectomy 03/01/2015  . 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 Low back pain 09/02/2013   Past Medical History:  Diagnosis Date  . Back muscle spasm   . Constipation   . Coronary artery disease   . GERD (gastroesophageal reflux disease)   . Headache(784.0)   . Hypertension   . Ruptured lumbar disc   . Seasonal  allergies   . Stroke [facility]) 2010   "mild"     Family History  Problem Relation Age of Onset  . Cancer Mother   . Cancer Father     Past Surgical History:  Procedure Laterality Date  . CARDIAC CATHETERIZATION  12/2008   [facility]  . CATARACT EXTRACTION, BILATERAL  2008   [facility]  . COLONOSCOPY  01/16/2012   Procedure: COLONOSCOPY;  Surgeon: Jamesetta So, MD;  [facility];  Service: Gastroenterology;  Laterality: N/A;  . COLONOSCOPY    . EYE SURGERY Bilateral    cataract  . LUMBAR LAMINECTOMY/DECOMPRESSION MICRODISCECTOMY Right 03/01/2015   Procedure: Right L1 Hemilaminectomy, Foraminotomy;  Surgeon: Marybelle Killings, MD;  Location: [facility];  Service: Orthopedics;  Laterality: Right;  . POSTERIOR LAMINECTOMY / DECOMPRESSION CERVICAL SPINE     [facility]   Social History   Occupational History  . Not on file  Tobacco Use  . Smoking  status: Former Smoker    Packs/day: 0.50    Years: 35.00    Pack years: 17.50    Types: Cigarettes    Start date: 11/08/1961    Quit date: 01/26/1997    Years since quitting: 23.3  . Smokeless tobacco: Never Used  Substance and Sexual Activity  . Alcohol use: No    Alcohol/week: 0.0 standard drinks    Comment: occasional wine/beer  . Drug use: No  . Sexual activity: Not on file

## 2020-05-26 DIAGNOSIS — Z8673 Personal history of transient ischemic attack (TIA), and cerebral infarction without residual deficits: Secondary | ICD-10-CM | POA: Diagnosis not present

## 2020-05-26 DIAGNOSIS — R7309 Other abnormal glucose: Secondary | ICD-10-CM | POA: Diagnosis not present

## 2020-05-26 DIAGNOSIS — E7849 Other hyperlipidemia: Secondary | ICD-10-CM | POA: Diagnosis not present

## 2020-05-26 DIAGNOSIS — I10 Essential (primary) hypertension: Secondary | ICD-10-CM | POA: Diagnosis not present

## 2020-06-25 DIAGNOSIS — Z8673 Personal history of transient ischemic attack (TIA), and cerebral infarction without residual deficits: Secondary | ICD-10-CM | POA: Diagnosis not present

## 2020-06-25 DIAGNOSIS — I10 Essential (primary) hypertension: Secondary | ICD-10-CM | POA: Diagnosis not present

## 2020-06-25 DIAGNOSIS — E7849 Other hyperlipidemia: Secondary | ICD-10-CM | POA: Diagnosis not present

## 2020-07-13 DIAGNOSIS — Z0001 Encounter for general adult medical examination with abnormal findings: Secondary | ICD-10-CM | POA: Diagnosis not present

## 2020-07-13 DIAGNOSIS — Z1389 Encounter for screening for other disorder: Secondary | ICD-10-CM | POA: Diagnosis not present

## 2020-07-13 DIAGNOSIS — I10 Essential (primary) hypertension: Secondary | ICD-10-CM | POA: Diagnosis not present

## 2020-07-13 DIAGNOSIS — E7849 Other hyperlipidemia: Secondary | ICD-10-CM | POA: Diagnosis not present

## 2020-07-13 DIAGNOSIS — Z8673 Personal history of transient ischemic attack (TIA), and cerebral infarction without residual deficits: Secondary | ICD-10-CM | POA: Diagnosis not present

## 2020-07-13 DIAGNOSIS — R7309 Other abnormal glucose: Secondary | ICD-10-CM | POA: Diagnosis not present

## 2020-07-13 DIAGNOSIS — E119 Type 2 diabetes mellitus without complications: Secondary | ICD-10-CM | POA: Diagnosis not present

## 2020-07-27 DIAGNOSIS — I10 Essential (primary) hypertension: Secondary | ICD-10-CM | POA: Diagnosis not present

## 2020-07-27 DIAGNOSIS — E7849 Other hyperlipidemia: Secondary | ICD-10-CM | POA: Diagnosis not present

## 2020-07-27 DIAGNOSIS — Z8673 Personal history of transient ischemic attack (TIA), and cerebral infarction without residual deficits: Secondary | ICD-10-CM | POA: Diagnosis not present

## 2020-07-29 ENCOUNTER — Other Ambulatory Visit (HOSPITAL_COMMUNITY): Payer: Self-pay | Admitting: Family Medicine

## 2020-07-29 DIAGNOSIS — Z1231 Encounter for screening mammogram for malignant neoplasm of breast: Secondary | ICD-10-CM

## 2020-08-16 ENCOUNTER — Other Ambulatory Visit: Payer: Self-pay

## 2020-08-16 ENCOUNTER — Ambulatory Visit (HOSPITAL_COMMUNITY)
Admission: RE | Admit: 2020-08-16 | Discharge: 2020-08-16 | Disposition: A | Discharge status: Home / Self Care | Payer: Medicare Other | Source: Ambulatory Visit | Attending: Family Medicine | Admitting: Family Medicine

## 2020-08-16 DIAGNOSIS — Z1231 Encounter for screening mammogram for malignant neoplasm of breast: Secondary | ICD-10-CM | POA: Insufficient documentation

## 2020-08-26 DIAGNOSIS — E7849 Other hyperlipidemia: Secondary | ICD-10-CM | POA: Diagnosis not present

## 2020-08-26 DIAGNOSIS — I2510 Atherosclerotic heart disease of native coronary artery without angina pectoris: Secondary | ICD-10-CM | POA: Diagnosis not present

## 2020-08-26 DIAGNOSIS — I10 Essential (primary) hypertension: Secondary | ICD-10-CM | POA: Diagnosis not present

## 2020-09-22 DIAGNOSIS — E119 Type 2 diabetes mellitus without complications: Secondary | ICD-10-CM | POA: Diagnosis not present

## 2020-09-25 DIAGNOSIS — E7849 Other hyperlipidemia: Secondary | ICD-10-CM | POA: Diagnosis not present

## 2020-09-25 DIAGNOSIS — I2510 Atherosclerotic heart disease of native coronary artery without angina pectoris: Secondary | ICD-10-CM | POA: Diagnosis not present

## 2020-09-25 DIAGNOSIS — I10 Essential (primary) hypertension: Secondary | ICD-10-CM | POA: Diagnosis not present

## 2021-02-23 DIAGNOSIS — I2510 Atherosclerotic heart disease of native coronary artery without angina pectoris: Secondary | ICD-10-CM | POA: Diagnosis not present

## 2021-02-23 DIAGNOSIS — I10 Essential (primary) hypertension: Secondary | ICD-10-CM | POA: Diagnosis not present

## 2021-02-23 DIAGNOSIS — E7849 Other hyperlipidemia: Secondary | ICD-10-CM | POA: Diagnosis not present

## 2021-04-26 DIAGNOSIS — E7849 Other hyperlipidemia: Secondary | ICD-10-CM | POA: Diagnosis not present

## 2021-04-26 DIAGNOSIS — I2510 Atherosclerotic heart disease of native coronary artery without angina pectoris: Secondary | ICD-10-CM | POA: Diagnosis not present

## 2021-04-26 DIAGNOSIS — I10 Essential (primary) hypertension: Secondary | ICD-10-CM | POA: Diagnosis not present

## 2021-05-19 DIAGNOSIS — E119 Type 2 diabetes mellitus without complications: Secondary | ICD-10-CM | POA: Diagnosis not present

## 2021-05-23 DIAGNOSIS — M1991 Primary osteoarthritis, unspecified site: Secondary | ICD-10-CM | POA: Diagnosis not present

## 2021-05-23 DIAGNOSIS — M25562 Pain in left knee: Secondary | ICD-10-CM | POA: Diagnosis not present

## 2021-06-07 DIAGNOSIS — I2510 Atherosclerotic heart disease of native coronary artery without angina pectoris: Secondary | ICD-10-CM | POA: Diagnosis not present

## 2021-06-07 DIAGNOSIS — E782 Mixed hyperlipidemia: Secondary | ICD-10-CM | POA: Diagnosis not present

## 2021-06-07 DIAGNOSIS — M1990 Unspecified osteoarthritis, unspecified site: Secondary | ICD-10-CM | POA: Diagnosis not present

## 2021-06-08 DIAGNOSIS — I2510 Atherosclerotic heart disease of native coronary artery without angina pectoris: Secondary | ICD-10-CM | POA: Diagnosis not present

## 2021-06-08 DIAGNOSIS — E7849 Other hyperlipidemia: Secondary | ICD-10-CM | POA: Diagnosis not present

## 2021-07-07 ENCOUNTER — Other Ambulatory Visit (HOSPITAL_COMMUNITY): Payer: Self-pay | Admitting: Family Medicine

## 2021-07-07 DIAGNOSIS — Z1231 Encounter for screening mammogram for malignant neoplasm of breast: Secondary | ICD-10-CM

## 2021-07-12 ENCOUNTER — Other Ambulatory Visit: Payer: Self-pay

## 2021-07-12 ENCOUNTER — Other Ambulatory Visit (HOSPITAL_COMMUNITY): Payer: Self-pay | Admitting: Family Medicine

## 2021-07-12 ENCOUNTER — Ambulatory Visit (HOSPITAL_COMMUNITY)
Admission: RE | Admit: 2021-07-12 | Discharge: 2021-07-12 | Disposition: A | Discharge status: Home / Self Care | Payer: Medicare Other | Source: Ambulatory Visit | Attending: Family Medicine | Admitting: Family Medicine

## 2021-07-12 DIAGNOSIS — R609 Edema, unspecified: Secondary | ICD-10-CM | POA: Diagnosis not present

## 2021-07-12 DIAGNOSIS — R600 Localized edema: Secondary | ICD-10-CM | POA: Diagnosis not present

## 2021-07-12 DIAGNOSIS — M79672 Pain in left foot: Secondary | ICD-10-CM | POA: Insufficient documentation

## 2021-07-12 DIAGNOSIS — M7989 Other specified soft tissue disorders: Secondary | ICD-10-CM | POA: Diagnosis not present

## 2021-07-14 DIAGNOSIS — Z8673 Personal history of transient ischemic attack (TIA), and cerebral infarction without residual deficits: Secondary | ICD-10-CM | POA: Diagnosis not present

## 2021-07-14 DIAGNOSIS — E119 Type 2 diabetes mellitus without complications: Secondary | ICD-10-CM | POA: Diagnosis not present

## 2021-07-14 DIAGNOSIS — I2510 Atherosclerotic heart disease of native coronary artery without angina pectoris: Secondary | ICD-10-CM | POA: Diagnosis not present

## 2021-07-14 DIAGNOSIS — E782 Mixed hyperlipidemia: Secondary | ICD-10-CM | POA: Diagnosis not present

## 2021-07-14 DIAGNOSIS — I10 Essential (primary) hypertension: Secondary | ICD-10-CM | POA: Diagnosis not present

## 2021-07-14 DIAGNOSIS — M1990 Unspecified osteoarthritis, unspecified site: Secondary | ICD-10-CM | POA: Diagnosis not present

## 2021-07-14 DIAGNOSIS — T466X5D Adverse effect of antihyperlipidemic and antiarteriosclerotic drugs, subsequent encounter: Secondary | ICD-10-CM | POA: Diagnosis not present

## 2021-08-18 ENCOUNTER — Other Ambulatory Visit: Payer: Self-pay

## 2021-08-18 ENCOUNTER — Ambulatory Visit (HOSPITAL_COMMUNITY)
Admission: RE | Admit: 2021-08-18 | Discharge: 2021-08-18 | Disposition: A | Discharge status: Home / Self Care | Payer: Medicare Other | Source: Ambulatory Visit | Attending: Family Medicine | Admitting: Family Medicine

## 2021-08-18 DIAGNOSIS — Z1231 Encounter for screening mammogram for malignant neoplasm of breast: Secondary | ICD-10-CM | POA: Diagnosis not present

## 2021-09-08 ENCOUNTER — Other Ambulatory Visit: Payer: Self-pay

## 2021-09-08 ENCOUNTER — Encounter: Payer: Self-pay | Admitting: Cardiology

## 2021-09-08 ENCOUNTER — Ambulatory Visit: Payer: Medicare Other | Admitting: Cardiology

## 2021-09-08 VITALS — BP 142/68 | HR 72 | Ht 62.0 in | Wt 182.0 lb

## 2021-09-08 DIAGNOSIS — I10 Essential (primary) hypertension: Secondary | ICD-10-CM

## 2021-09-08 DIAGNOSIS — I2510 Atherosclerotic heart disease of native coronary artery without angina pectoris: Secondary | ICD-10-CM

## 2021-09-08 DIAGNOSIS — E782 Mixed hyperlipidemia: Secondary | ICD-10-CM | POA: Diagnosis not present

## 2021-09-08 MED ORDER — ROSUVASTATIN CALCIUM 5 MG PO TABS: 5.0000 mg | ORAL_TABLET | ORAL | 3 refills | Status: DC

## 2021-09-08 NOTE — Patient Instructions (Signed)
Medication Instructions:  Your physician has recommended you make the following change in your medication:  STOP Pravastatin  START Crestor 5 mg tablets on Monday, Wednesday, and Friday START Aspirin 81 mg tablets daily   *If you need a refill on your cardiac medications before your next appointment, please call your pharmacy*   Lab Work: None If you have labs (blood work) drawn today and your tests are completely normal, you will receive your results only by: [place] (if you have MyChart) OR A paper copy in the mail If you have any lab test that is abnormal or we need to change your treatment, we will call you to review the results.   Testing/Procedures: None   Follow-Up: At [facility], you and your health needs are our priority.  As part of our continuing mission to provide you with exceptional heart care, we have created designated Provider Care Teams.  These Care Teams include your primary Cardiologist (physician) and Advanced Practice Providers (APPs -  Physician Assistants and Nurse Practitioners) who all work together to provide you with the care you need, when you need it.  We recommend signing up for the patient portal called "MyChart".  Sign up information is provided on this After Visit Summary.  MyChart is used to connect with patients for Virtual Visits (Telemedicine).  Patients are able to view lab/test results, encounter notes, upcoming appointments, etc.  Non-urgent messages can be sent to your provider as well.   To learn more about what you can do with MyChart, go to [email or web address].    Your next appointment:   3 month(s)  The format for your next appointment:   In Person  Provider:   You may see Carlyle Dolly, MD or one of the following Advanced Practice Providers on your designated Care Team:   Bernerd Pho, PA-C  Ermalinda Barrios, [place]    Other Instructions

## 2021-09-08 NOTE — Progress Notes (Signed)
Clinical Summary Ms. Keast is a 75 y.o.female seen today as a new patient, last seen in our office in 2016   1. CAD - cath 2010 with 70% ramus stenosis, otherwise patent vessels. LVEF 65% - 01/2015 Lexiscan MPI: no ischemia   - no chest pain - she stopped ASA last year she reports      2. HTN - compliant with meds. Significant dizziness on higher doses of coreg.  - at recent pcp visit bp was at goal  3 . HL - lipitor caused muscle aches. Tolerates pravastatin well.  - reports constipation on pravastatin (listed in postmarketing side effects)    4. History of CVA - she reports mild CVA several years ago.   Past Medical History:  Diagnosis Date   Back muscle spasm    Constipation    Coronary artery disease    GERD (gastroesophageal reflux disease)    Headache(784.0)    Hypertension    Ruptured lumbar disc    Seasonal allergies    Stroke [facility]) 2010   "mild"      Allergies  Allergen Reactions   Hydrocodone Other (See Comments)    Hallucinations   Statins     Pt unsure of reaction      Current Outpatient Medications  Medication Sig Dispense Refill   aspirin 81 MG tablet Take 81 mg by mouth daily.     carvedilol (COREG) 3.125 MG tablet Take 3.125 mg by mouth 2 (two) times daily with a meal.     lisinopril-hydrochlorothiazide (PRINZIDE,ZESTORETIC) 20-25 MG per tablet Take 1 tablet by mouth daily.     loratadine (CLARITIN) 10 MG tablet Take 10 mg by mouth daily as needed for allergies.     metFORMIN (GLUCOPHAGE) 500 MG tablet SMARTSIG:1 Tablet(s) By Mouth Morning-Evening     naproxen sodium (ANAPROX) 220 MG tablet Take 220 mg by mouth 2 (two) times daily with a meal.     pravastatin (PRAVACHOL) 20 MG tablet Take 20 mg by mouth daily. Doesn't take this daily because it makes her constipated     No current facility-administered medications for this visit.     Past Surgical History:  Procedure Laterality Date   CARDIAC CATHETERIZATION  12/2008   [facility]    CATARACT EXTRACTION, BILATERAL  2008   [facility]   COLONOSCOPY  01/16/2012   Procedure: COLONOSCOPY;  Surgeon: Jamesetta So, MD;  [facility];  Service: Gastroenterology;  Laterality: N/A;   COLONOSCOPY     EYE SURGERY Bilateral    cataract   LUMBAR LAMINECTOMY/DECOMPRESSION MICRODISCECTOMY Right 03/01/2015   Procedure: Right L1 Hemilaminectomy, Foraminotomy;  Surgeon: Marybelle Killings, MD;  Location: [facility];  Service: Orthopedics;  Laterality: Right;   POSTERIOR LAMINECTOMY / DECOMPRESSION CERVICAL SPINE     [facility]     Allergies  Allergen Reactions   Hydrocodone Other (See Comments)    Hallucinations   Statins     Pt unsure of reaction       Family History  Problem Relation Age of Onset   Cancer Mother    Cancer Father      Social History Ms. Luhmann reports that she quit smoking about 24 years ago. Her smoking use included cigarettes. She started smoking about 59 years ago. She has a 17.50 pack-year smoking history. She has never used smokeless tobacco. Ms. Petros reports no history of alcohol use.   Review of Systems CONSTITUTIONAL: No weight loss, fever, chills, weakness or fatigue.  HEENT: Eyes:  No visual loss, blurred vision, double vision or yellow sclerae.No hearing loss, sneezing, congestion, runny nose or sore throat.  SKIN: No rash or itching.  CARDIOVASCULAR: per hpi RESPIRATORY: No shortness of breath, cough or sputum.  GASTROINTESTINAL: No anorexia, nausea, vomiting or diarrhea. No abdominal pain or blood.  GENITOURINARY: No burning on urination, no polyuria NEUROLOGICAL: No headache, dizziness, syncope, paralysis, ataxia, numbness or tingling in the extremities. No change in bowel or bladder control.  MUSCULOSKELETAL: No muscle, back pain, joint pain or stiffness.  LYMPHATICS: No enlarged nodes. No history of splenectomy.  PSYCHIATRIC: No history of depression or anxiety.  ENDOCRINOLOGIC: No reports of sweating, cold or heat intolerance.  No polyuria or polydipsia.  Marland Kitchen   Physical Examination Today's Vitals   09/08/21 1329  BP: (!) 142/68  Pulse: 72  SpO2: 96%  Weight: 182 lb (82.6 kg)  Height: 5\' 2"  (1.575 m)   Body mass index is 33.29 kg/m.  Gen: resting comfortably, no acute distress HEENT: no scleral icterus, pupils equal round and reactive, no palptable cervical adenopathy,  CV: RRR, no m/r/g no jvd Resp: Clear to auscultation bilaterally GI: abdomen is soft, non-tender, non-distended, normal bowel sounds, no hepatosplenomegaly MSK: extremities are warm, no edema.  Skin: warm, no rash Neuro:  no focal deficits Psych: appropriate affect   Diagnostic Studies  12/2008 Cath HEMODYNAMIC RESULTS:  1. Aortic systolic pressure 967, diastolic pressure 56.  2. Left ventricular systolic pressure 893, end-diastolic pressure 5.    SELECTIVE CHOLANGIOGRAPHY:  1. Left main normal.  2. LAD normal.  3. Left circumflex normal.  4. Ramus intermedius Anson Peddie which arose as a high marginal was      moderately sized and had a 70% fairly focal proximal stenosis.  5. Right coronary was dominant normal.  6. Left ventriculography; RAO left ventriculogram was performed using      25 mL of Visipaque dye at 12 mL per second.  The overall LVEF was      estimated at greater than 60% without focal wall motion      abnormalities.    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  I do not think this is contributing to her symptoms.  Continued medical  therapy will be recommended.     06/2009 Carotid US Findings:    RIGHT CAROTID ARTERY: There is a hypoechoic structure in the right thyroid tissue which is predominately cystic and measures up to 1 cm.  There may be a small septation within this cyst.  The right carotid arteries are patent without significant plaque formation.    RIGHT VERTEBRAL ARTERY:  Antegrade flow and normal waveform in the right vertebral artery.    LEFT CAROTID ARTERY: Mild  amount plaque in the left common carotid artery.  There is plaque involving the left carotid bulb. Prominent plaque in the left carotid bulb and proximal left internal carotid artery.  This prominent plaque is likely calcified due to the posterior acoustic shadowing.  However, this does not appear to be an occlusive plaque.    LEFT VERTEBRAL ARTERY:  Antegrade flow and normal waveform in the left vertebral artery.    IMPRESSION:    Prominent calcific plaque involving the left carotid bulb and the proximal left internal carotid artery.  There is no significant internal carotid artery stenosis.  Estimated degree of stenosis in the internal carotid arteries is less than 50%.   01/2015 Lexiscan MPI   IMPRESSION: 1. No reversible ischemia or infarction.  2. Normal left ventricular wall motion.   3. Left ventricular ejection fraction 87%   4. Low-risk stress test findings*.   Assessment and Plan   1. CAD - no symptoms, cotinue current meds - given known CAD would restart ASA 81mg  daily EKG today shows SR, no ischemic changes   2. HTN - elevated today but at recent pcp visit was at goal, continue current meds   3. Hyperlipidemia -difficulty tolerating statins. Has tried pravastatin, atorvastatin -will try crestor 5mg  MWF, if does not tolerate would refer to lipid clinic to consider repatha      Arnoldo Lenis, M.D.

## 2021-11-23 DIAGNOSIS — J029 Acute pharyngitis, unspecified: Secondary | ICD-10-CM | POA: Diagnosis not present

## 2021-11-24 ENCOUNTER — Other Ambulatory Visit: Payer: Self-pay

## 2021-11-24 ENCOUNTER — Inpatient Hospital Stay (HOSPITAL_COMMUNITY)
Admission: EM | Admit: 2021-11-24 | Discharge: 2021-11-26 | DRG: 193 | Disposition: A | Discharge status: Home / Self Care | Payer: Medicare Other | Attending: Internal Medicine | Admitting: Internal Medicine

## 2021-11-24 ENCOUNTER — Encounter (HOSPITAL_COMMUNITY): Payer: Self-pay | Admitting: Emergency Medicine

## 2021-11-24 ENCOUNTER — Emergency Department (HOSPITAL_COMMUNITY): Payer: Medicare Other

## 2021-11-24 DIAGNOSIS — M961 Postlaminectomy syndrome, not elsewhere classified: Secondary | ICD-10-CM | POA: Diagnosis not present

## 2021-11-24 DIAGNOSIS — Z8673 Personal history of transient ischemic attack (TIA), and cerebral infarction without residual deficits: Secondary | ICD-10-CM | POA: Diagnosis not present

## 2021-11-24 DIAGNOSIS — R6889 Other general symptoms and signs: Secondary | ICD-10-CM | POA: Diagnosis not present

## 2021-11-24 DIAGNOSIS — D72829 Elevated white blood cell count, unspecified: Secondary | ICD-10-CM | POA: Diagnosis not present

## 2021-11-24 DIAGNOSIS — I959 Hypotension, unspecified: Secondary | ICD-10-CM | POA: Diagnosis not present

## 2021-11-24 DIAGNOSIS — Z885 Allergy status to narcotic agent status: Secondary | ICD-10-CM | POA: Diagnosis not present

## 2021-11-24 DIAGNOSIS — J111 Influenza due to unidentified influenza virus with other respiratory manifestations: Secondary | ICD-10-CM

## 2021-11-24 DIAGNOSIS — W06XXXA Fall from bed, initial encounter: Secondary | ICD-10-CM | POA: Diagnosis present

## 2021-11-24 DIAGNOSIS — Z9841 Cataract extraction status, right eye: Secondary | ICD-10-CM | POA: Diagnosis not present

## 2021-11-24 DIAGNOSIS — Z87891 Personal history of nicotine dependence: Secondary | ICD-10-CM

## 2021-11-24 DIAGNOSIS — I10 Essential (primary) hypertension: Secondary | ICD-10-CM | POA: Diagnosis present

## 2021-11-24 DIAGNOSIS — E872 Acidosis, unspecified: Secondary | ICD-10-CM | POA: Diagnosis present

## 2021-11-24 DIAGNOSIS — J302 Other seasonal allergic rhinitis: Secondary | ICD-10-CM | POA: Diagnosis not present

## 2021-11-24 DIAGNOSIS — E876 Hypokalemia: Secondary | ICD-10-CM | POA: Diagnosis not present

## 2021-11-24 DIAGNOSIS — K219 Gastro-esophageal reflux disease without esophagitis: Secondary | ICD-10-CM | POA: Diagnosis present

## 2021-11-24 DIAGNOSIS — E039 Hypothyroidism, unspecified: Secondary | ICD-10-CM | POA: Diagnosis present

## 2021-11-24 DIAGNOSIS — E871 Hypo-osmolality and hyponatremia: Secondary | ICD-10-CM | POA: Diagnosis not present

## 2021-11-24 DIAGNOSIS — J101 Influenza due to other identified influenza virus with other respiratory manifestations: Secondary | ICD-10-CM | POA: Diagnosis present

## 2021-11-24 DIAGNOSIS — E860 Dehydration: Secondary | ICD-10-CM | POA: Diagnosis present

## 2021-11-24 DIAGNOSIS — K7200 Acute and subacute hepatic failure without coma: Secondary | ICD-10-CM | POA: Diagnosis present

## 2021-11-24 DIAGNOSIS — Z7982 Long term (current) use of aspirin: Secondary | ICD-10-CM

## 2021-11-24 DIAGNOSIS — E782 Mixed hyperlipidemia: Secondary | ICD-10-CM | POA: Diagnosis present

## 2021-11-24 DIAGNOSIS — R112 Nausea with vomiting, unspecified: Secondary | ICD-10-CM | POA: Diagnosis present

## 2021-11-24 DIAGNOSIS — Z79899 Other long term (current) drug therapy: Secondary | ICD-10-CM | POA: Diagnosis not present

## 2021-11-24 DIAGNOSIS — I499 Cardiac arrhythmia, unspecified: Secondary | ICD-10-CM | POA: Diagnosis not present

## 2021-11-24 DIAGNOSIS — Z79890 Hormone replacement therapy (postmenopausal): Secondary | ICD-10-CM

## 2021-11-24 DIAGNOSIS — Z9842 Cataract extraction status, left eye: Secondary | ICD-10-CM | POA: Diagnosis not present

## 2021-11-24 DIAGNOSIS — R9431 Abnormal electrocardiogram [ECG] [EKG]: Secondary | ICD-10-CM | POA: Diagnosis not present

## 2021-11-24 DIAGNOSIS — R531 Weakness: Secondary | ICD-10-CM

## 2021-11-24 DIAGNOSIS — I440 Atrioventricular block, first degree: Secondary | ICD-10-CM | POA: Diagnosis not present

## 2021-11-24 DIAGNOSIS — R7401 Elevation of levels of liver transaminase levels: Secondary | ICD-10-CM | POA: Diagnosis present

## 2021-11-24 DIAGNOSIS — Y92009 Unspecified place in unspecified non-institutional (private) residence as the place of occurrence of the external cause: Secondary | ICD-10-CM | POA: Diagnosis not present

## 2021-11-24 DIAGNOSIS — Z888 Allergy status to other drugs, medicaments and biological substances status: Secondary | ICD-10-CM

## 2021-11-24 DIAGNOSIS — Z20822 Contact with and (suspected) exposure to covid-19: Secondary | ICD-10-CM | POA: Diagnosis not present

## 2021-11-24 DIAGNOSIS — E1165 Type 2 diabetes mellitus with hyperglycemia: Secondary | ICD-10-CM | POA: Diagnosis not present

## 2021-11-24 DIAGNOSIS — I2510 Atherosclerotic heart disease of native coronary artery without angina pectoris: Secondary | ICD-10-CM | POA: Diagnosis present

## 2021-11-24 DIAGNOSIS — I517 Cardiomegaly: Secondary | ICD-10-CM | POA: Diagnosis not present

## 2021-11-24 DIAGNOSIS — R0689 Other abnormalities of breathing: Secondary | ICD-10-CM | POA: Diagnosis not present

## 2021-11-24 DIAGNOSIS — W19XXXA Unspecified fall, initial encounter: Secondary | ICD-10-CM

## 2021-11-24 DIAGNOSIS — Z743 Need for continuous supervision: Secondary | ICD-10-CM | POA: Diagnosis not present

## 2021-11-24 DIAGNOSIS — R059 Cough, unspecified: Secondary | ICD-10-CM | POA: Diagnosis not present

## 2021-11-24 DIAGNOSIS — R509 Fever, unspecified: Secondary | ICD-10-CM | POA: Diagnosis not present

## 2021-11-24 LAB — COMPREHENSIVE METABOLIC PANEL
ALT: 69 U/L — ABNORMAL HIGH (ref 0–44)
AST: 75 U/L — ABNORMAL HIGH (ref 15–41)
Albumin: 4.1 g/dL (ref 3.5–5.0)
Alkaline Phosphatase: 72 U/L (ref 38–126)
Anion gap: 12 (ref 5–15)
BUN: 18 mg/dL (ref 8–23)
CO2: 25 mmol/L (ref 22–32)
Calcium: 9.7 mg/dL (ref 8.9–10.3)
Chloride: 94 mmol/L — ABNORMAL LOW (ref 98–111)
Creatinine, Ser: 0.83 mg/dL (ref 0.44–1.00)
GFR, Estimated: >60 mL/min (ref >60)
Glucose, Bld: 220 mg/dL — ABNORMAL HIGH (ref 70–99)
Potassium: 2.8 mmol/L — ABNORMAL LOW (ref 3.5–5.1)
Sodium: 131 mmol/L — ABNORMAL LOW (ref 135–145)
Total Bilirubin: 0.6 mg/dL (ref 0.3–1.2)
Total Protein: 7.9 g/dL (ref 6.5–8.1)

## 2021-11-24 LAB — URINALYSIS, ROUTINE W REFLEX MICROSCOPIC
Bilirubin Urine: NEGATIVE
Glucose, UA: NEGATIVE mg/dL
Ketones, ur: 5 mg/dL — AB
Leukocytes,Ua: NEGATIVE
Nitrite: NEGATIVE
Protein, ur: 30 mg/dL — AB
Specific Gravity, Urine: 1.018 (ref 1.005–1.030)
pH: 5 (ref 5.0–8.0)

## 2021-11-24 LAB — CBC
HCT: 48.2 % — ABNORMAL HIGH (ref 36.0–46.0)
Hemoglobin: 16.1 g/dL — ABNORMAL HIGH (ref 12.0–15.0)
MCH: 29.7 pg (ref 26.0–34.0)
MCHC: 33.4 g/dL (ref 30.0–36.0)
MCV: 88.9 fL (ref 80.0–100.0)
Platelets: 221 10*3/uL (ref 150–400)
RBC: 5.42 MIL/uL — ABNORMAL HIGH (ref 3.87–5.11)
RDW: 12.9 % (ref 11.5–15.5)
WBC: 12 10*3/uL — ABNORMAL HIGH (ref 4.0–10.5)
nRBC: 0 % (ref 0.0–0.2)

## 2021-11-24 LAB — RESP PANEL BY RT-PCR (FLU A&B, COVID) ARPGX2
Influenza A by PCR: POSITIVE — AB
Influenza B by PCR: NEGATIVE
SARS Coronavirus 2 by RT PCR: NEGATIVE

## 2021-11-24 LAB — LIPASE, BLOOD: Lipase: 39 U/L (ref 11–51)

## 2021-11-24 MED ORDER — SODIUM CHLORIDE 0.9 % IV SOLN
500.0000 mg | Freq: Once | INTRAVENOUS | Status: AC
  Administered 2021-11-25: 500 mg via INTRAVENOUS
  Filled 2021-11-24: qty 5

## 2021-11-24 MED ORDER — LACTATED RINGERS IV BOLUS (SEPSIS)
1000.0000 mL | Freq: Once | INTRAVENOUS | Status: AC
  Administered 2021-11-25: 1000 mL via INTRAVENOUS

## 2021-11-24 MED ORDER — POTASSIUM CHLORIDE 10 MEQ/100ML IV SOLN
10.0000 meq | INTRAVENOUS | Status: AC
  Administered 2021-11-25 (×2): 10 meq via INTRAVENOUS
  Filled 2021-11-24 (×3): qty 100

## 2021-11-24 MED ORDER — SODIUM CHLORIDE 0.9 % IV BOLUS
1000.0000 mL | Freq: Once | INTRAVENOUS | Status: AC
  Administered 2021-11-24: 23:00:00 1000 mL via INTRAVENOUS

## 2021-11-24 MED ORDER — ONDANSETRON HCL 4 MG/2ML IJ SOLN
4.0000 mg | Freq: Once | INTRAMUSCULAR | Status: AC
  Administered 2021-11-24: 4 mg via INTRAVENOUS
  Filled 2021-11-24: qty 2

## 2021-11-24 MED ORDER — SODIUM CHLORIDE 0.9 % IV SOLN
1.0000 g | Freq: Once | INTRAVENOUS | Status: AC
  Administered 2021-11-24: 1 g via INTRAVENOUS
  Filled 2021-11-24: qty 10

## 2021-11-24 MED ORDER — OSELTAMIVIR PHOSPHATE 75 MG PO CAPS
75.0000 mg | ORAL_CAPSULE | Freq: Once | ORAL | Status: AC
  Administered 2021-11-25: 75 mg via ORAL
  Filled 2021-11-24: qty 1

## 2021-11-24 NOTE — ED Triage Notes (Signed)
Pt with c/o Nausea and vomiting since this am. Pt states she cannot keep anything down. Also c/o weakness. Pt states she was started on Amoxicillin yesterday but not sure why.

## 2021-11-24 NOTE — ED Provider Notes (Signed)
[facility] EMERGENCY DEPARTMENT Provider Note   CSN: [id number] Arrival date & time: 11/24/21  2054     History Chief Complaint  Patient presents with   Nausea and vomiting    Lisa Griffith is a 75 y.o. female.  HPI     Lisa Griffith is a 75 y.o. female with past medical history of coronary artery disease, GERD, and hypertension who presents to the Emergency Department complaining of nausea vomiting and generalized weakness.  Symptoms have been present for 2 days.  She was seen initially by PCP and started on amoxicillin yesterday.  She believes her vomiting is related to taking hydrocodone that was prescribed by PCP.  She does endorse one episode of vomiting yesterday before she picked up her medications.  She has had 2 episodes of vomiting today as well.  Endorses generalized weakness that has been worse today.  Does state that she was sitting on a bedside "pot" last evening and fell in the floor.  She denies injuries from her fall.  States she was sitting in the floor for approximately 2 hours before she was finally able to get back in bed.  Denies any head injury or LOC.  No diarrhea or dysuria.  She denies any chest pain or shortness of breath.  States that she had a COVID test yesterday that was negative.   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   CATARACT EXTRACTION, BILATERAL  2008   [facility]   COLONOSCOPY  01/16/2012   Procedure: COLONOSCOPY;  Surgeon: Jamesetta So, MD;  Location: [facility]  ENDO SUITE;  Service: Gastroenterology;  Laterality: N/A;   COLONOSCOPY     EYE SURGERY Bilateral    cataract   LUMBAR LAMINECTOMY/DECOMPRESSION MICRODISCECTOMY Right 03/01/2015   Procedure: Right L1 Hemilaminectomy, Foraminotomy;  Surgeon: Marybelle Killings, MD;  Location: [place];  Service: Orthopedics;  Laterality: Right;   POSTERIOR LAMINECTOMY / DECOMPRESSION CERVICAL SPINE     [facility]     OB History     Gravida  5   Para  3   Term  3   Preterm      AB  2   Living         SAB  2   IAB      Ectopic      Multiple      Live Births              Family History  Problem Relation Age of Onset   Cancer Mother    Cancer Father     Social History   Tobacco Use   Smoking status: Former    Packs/day: 0.50    Years: 35.00    Pack years: 17.50    Types: Cigarettes    Start date: 11/08/1961    Quit date: 01/26/1997    Years since quitting: 24.8   Smokeless tobacco: Never  Vaping Use   Vaping  Use: Never used  Substance Use Topics   Alcohol use: No    Alcohol/week: 0.0 standard drinks    Comment: occasional wine/beer   Drug use: No    Home Medications Prior to Admission medications   Medication Sig Start Date End Date Taking? Authorizing Provider  aspirin 81 MG tablet Take 81 mg by mouth daily.    [provider]  carvedilol (COREG) 3.125 MG tablet Take 3.125 mg by mouth 2 (two) times daily with a meal.    [provider]  levothyroxine (SYNTHROID) 50 MCG tablet Take 50 mcg by mouth daily. 07/15/21   [provider]  lisinopril-hydrochlorothiazide (PRINZIDE,ZESTORETIC) 20-25 MG per tablet Take 1 tablet by mouth daily.    [provider]  loratadine (CLARITIN) 10 MG tablet Take 10 mg by mouth daily as needed for allergies.    [provider]  metFORMIN (GLUCOPHAGE) 500 MG tablet SMARTSIG:1 Tablet(s) By Mouth Morning-Evening Patient not taking: Reported on 09/08/2021 03/04/20   [provider]  naproxen sodium  (ANAPROX) 220 MG tablet Take 220 mg by mouth 2 (two) times daily with a meal. Patient not taking: Reported on 09/08/2021    [provider]  rosuvastatin (CRESTOR) 5 MG tablet Take 1 tablet (5 mg total) by mouth every Monday, Wednesday, and Friday. 09/09/21 12/08/21  Arnoldo Lenis, MD    Allergies    Hydrocodone and Statins  Review of Systems   Review of Systems  Constitutional:  Positive for fatigue. Negative for chills and fever.  HENT:  Positive for congestion. Negative for sore throat.   Respiratory:  Negative for cough and shortness of breath.   Cardiovascular:  Negative for chest pain.  Gastrointestinal:  Positive for nausea and vomiting. Negative for abdominal pain and diarrhea.  Genitourinary:  Negative for difficulty urinating, dysuria, flank pain and hematuria.  Musculoskeletal:  Negative for myalgias, neck pain and neck stiffness.  Skin:  Negative for rash.  Neurological:  Positive for weakness (Generalized weakness). Negative for dizziness, syncope, numbness and headaches.  Hematological:  Does not bruise/bleed easily.  Psychiatric/Behavioral:  Negative for confusion.   All other systems reviewed and are negative.  Physical Exam Updated Vital Signs BP (!) 176/73    Pulse 85    Temp (!) 100.7 F (38.2 C) (Oral)    Resp 18    Ht 5\' 2"  (1.575 m)    Wt 82.6 kg    SpO2 95%    BMI 33.31 kg/m   Physical Exam Vitals and nursing note reviewed.  Constitutional:      General: She is not in acute distress.    Appearance: Normal appearance.  Cardiovascular:     Rate and Rhythm: Normal rate and regular rhythm.     Pulses: Normal pulses.  Pulmonary:     Effort: Pulmonary effort is normal. No respiratory distress.     Breath sounds: No stridor. No wheezing or rales.  Abdominal:     General: There is no distension.     Palpations: Abdomen is soft.     Tenderness: There is no abdominal tenderness.  Musculoskeletal:     Right lower leg: No edema.     Left lower  leg: No edema.  Skin:    General: Skin is warm.     Capillary Refill: Capillary refill takes less than 2 seconds.  Neurological:     General: No focal deficit present.     Mental Status: She is alert.     GCS: GCS eye subscore  is 4. GCS verbal subscore is 5. GCS motor subscore is 6.     Sensory: Sensation is intact. No sensory deficit.     Motor: Motor function is intact. No weakness.     Coordination: Coordination is intact.     Comments: CN II through XII intact.  Speech clear.  No pronator drift.  No facial droop.  Mentating well.    ED Results / Procedures / Treatments   Labs (all labs ordered are listed, but only abnormal results are displayed) Labs Reviewed  COMPREHENSIVE METABOLIC PANEL - Abnormal; Notable for the following components:      Result Value   Sodium 131 (*)    Potassium 2.8 (*)    Chloride 94 (*)    Glucose, Bld 220 (*)    AST 75 (*)    ALT 69 (*)    All other components within normal limits  CBC - Abnormal; Notable for the following components:   WBC 12.0 (*)    RBC 5.42 (*)    Hemoglobin 16.1 (*)    HCT 48.2 (*)    All other components within normal limits  URINALYSIS, ROUTINE W REFLEX MICROSCOPIC - Abnormal; Notable for the following components:   Hgb urine dipstick LARGE (*)    Ketones, ur 5 (*)    Protein, ur 30 (*)    Bacteria, UA RARE (*)    All other components within normal limits  RESP PANEL BY RT-PCR (FLU A&B, COVID) ARPGX2  CULTURE, BLOOD (ROUTINE X 2)  CULTURE, BLOOD (ROUTINE X 2)  URINE CULTURE  LIPASE, BLOOD  LACTIC ACID, PLASMA  LACTIC ACID, PLASMA  PROTIME-INR  APTT    EKG EKG Interpretation  Date/Time:  Thursday November 24 2021 22:42:39 EST Ventricular Rate:  85 PR Interval:  218 QRS Duration: 67 QT Interval:  394 QTC Calculation: 469 R Axis:   49 Text Interpretation: Sinus rhythm Borderline prolonged PR interval Borderline T abnormalities, anterior leads Confirmed by Noemi Chapel [phone]) on 11/24/2021 11:05:43  PM  Radiology DG Chest Portable 1 View  Result Date: 11/24/2021 CLINICAL DATA:  Cough and fever EXAM: PORTABLE CHEST 1 VIEW COMPARISON:  12/18/2013 FINDINGS: Mild cardiomegaly. No consolidation. Aortic atherosclerosis. Hardware in the cervical spine. IMPRESSION: No active disease.  Mild cardiomegaly Electronically Signed   By: Donavan Foil M.D.   On: 11/24/2021 23:09    Procedures Procedures    CRITICAL CARE Performed by: Tylisa Alcivar Total critical care time: 35 minutes Critical care time was exclusive of separately billable procedures and treating other patients. Critical care was necessary to treat or prevent imminent or life-threatening deterioration. Critical care was time spent personally by me on the following activities: development of treatment plan with patient and/or surrogate as well as nursing, discussions with consultants, evaluation of patient's response to treatment, examination of patient, obtaining history from patient or surrogate, ordering and performing treatments and interventions, ordering and review of laboratory studies, ordering and review of radiographic studies, pulse oximetry and re-evaluation of patient's condition.  Medications Ordered in ED Medications  ondansetron (ZOFRAN) injection 4 mg (has no administration in time range)  potassium chloride 10 mEq in 100 mL IVPB (has no administration in time range)  lactated ringers bolus 1,000 mL (has no administration in time range)  sodium chloride 0.9 % bolus 1,000 mL (1,000 mLs Intravenous New Bag/Given 11/24/21 2242)    ED Course  I have reviewed the triage vital signs and the nursing notes.  Pertinent labs & imaging results that were  available during my care of the patient were reviewed by me and considered in my medical decision making (see chart for details).    MDM Rules/Calculators/A&P                          Patient here from home with generalized weakness, fever, nausea and vomiting.  No  shortness of breath or chest or abdominal pain.  Symptoms for 1 to 2 days.  Was seen by PCP initially and treated with amoxicillin with unknown source of infection.  She had COVID test yesterday that was negative.  Here due to persistent vomiting and generalized weakness.  Fall from a short distance last evening.  Denies injury.  On exam, patient has low-grade fever.  No hypoxia or tachycardia.  Generalized weakness without focal neurodeficit.  Abdomen is soft and nontender.  No active vomiting during exam.  Initial laboratory studies show hypokalemia with potassium of 2.8.  Sodium 131.  Lipase unremarkable.  Urinalysis without evidence of infection.  Leukocytosis with white count of 12,000.  Patient's symptoms concerning for evolving sepsis.  Sepsis protocol initiated with fluids, possible Pneumonia on initial x-ray interpretation.  we will start IV potassium and ceftriaxone and azithromycin.  Lactic acid pending.  patient critically ill.  She will need hospital admission, agreeable to plan  Discussed findings with [facility] hospitalist, Dr. Josephine Cables who agrees to admit.      Final Clinical Impression(s) / ED Diagnoses Final diagnoses:  Hypokalemia  Influenza    Rx / DC Orders ED Discharge Orders     None        Kem Parkinson, PA-C 11/25/21 0009    Noemi Chapel, MD 11/26/21 1059

## 2021-11-25 ENCOUNTER — Encounter (HOSPITAL_COMMUNITY): Payer: Self-pay | Admitting: Internal Medicine

## 2021-11-25 DIAGNOSIS — J302 Other seasonal allergic rhinitis: Secondary | ICD-10-CM | POA: Diagnosis present

## 2021-11-25 DIAGNOSIS — Z885 Allergy status to narcotic agent status: Secondary | ICD-10-CM | POA: Diagnosis not present

## 2021-11-25 DIAGNOSIS — Z7982 Long term (current) use of aspirin: Secondary | ICD-10-CM | POA: Diagnosis not present

## 2021-11-25 DIAGNOSIS — W19XXXA Unspecified fall, initial encounter: Secondary | ICD-10-CM

## 2021-11-25 DIAGNOSIS — E871 Hypo-osmolality and hyponatremia: Secondary | ICD-10-CM | POA: Diagnosis present

## 2021-11-25 DIAGNOSIS — R7401 Elevation of levels of liver transaminase levels: Secondary | ICD-10-CM

## 2021-11-25 DIAGNOSIS — J101 Influenza due to other identified influenza virus with other respiratory manifestations: Secondary | ICD-10-CM | POA: Diagnosis present

## 2021-11-25 DIAGNOSIS — Z9842 Cataract extraction status, left eye: Secondary | ICD-10-CM | POA: Diagnosis not present

## 2021-11-25 DIAGNOSIS — E876 Hypokalemia: Secondary | ICD-10-CM | POA: Diagnosis present

## 2021-11-25 DIAGNOSIS — Z9841 Cataract extraction status, right eye: Secondary | ICD-10-CM | POA: Diagnosis not present

## 2021-11-25 DIAGNOSIS — D72829 Elevated white blood cell count, unspecified: Secondary | ICD-10-CM | POA: Diagnosis present

## 2021-11-25 DIAGNOSIS — I10 Essential (primary) hypertension: Secondary | ICD-10-CM | POA: Diagnosis present

## 2021-11-25 DIAGNOSIS — E782 Mixed hyperlipidemia: Secondary | ICD-10-CM

## 2021-11-25 DIAGNOSIS — E860 Dehydration: Secondary | ICD-10-CM | POA: Diagnosis present

## 2021-11-25 DIAGNOSIS — Y92009 Unspecified place in unspecified non-institutional (private) residence as the place of occurrence of the external cause: Secondary | ICD-10-CM | POA: Diagnosis not present

## 2021-11-25 DIAGNOSIS — Z888 Allergy status to other drugs, medicaments and biological substances status: Secondary | ICD-10-CM | POA: Diagnosis not present

## 2021-11-25 DIAGNOSIS — Z8673 Personal history of transient ischemic attack (TIA), and cerebral infarction without residual deficits: Secondary | ICD-10-CM | POA: Diagnosis not present

## 2021-11-25 DIAGNOSIS — E039 Hypothyroidism, unspecified: Secondary | ICD-10-CM

## 2021-11-25 DIAGNOSIS — Z79899 Other long term (current) drug therapy: Secondary | ICD-10-CM | POA: Diagnosis not present

## 2021-11-25 DIAGNOSIS — Z20822 Contact with and (suspected) exposure to covid-19: Secondary | ICD-10-CM | POA: Diagnosis present

## 2021-11-25 DIAGNOSIS — R112 Nausea with vomiting, unspecified: Secondary | ICD-10-CM

## 2021-11-25 DIAGNOSIS — I959 Hypotension, unspecified: Secondary | ICD-10-CM | POA: Diagnosis present

## 2021-11-25 DIAGNOSIS — M961 Postlaminectomy syndrome, not elsewhere classified: Secondary | ICD-10-CM | POA: Diagnosis present

## 2021-11-25 DIAGNOSIS — E872 Acidosis, unspecified: Secondary | ICD-10-CM

## 2021-11-25 DIAGNOSIS — Z87891 Personal history of nicotine dependence: Secondary | ICD-10-CM | POA: Diagnosis not present

## 2021-11-25 DIAGNOSIS — R531 Weakness: Secondary | ICD-10-CM

## 2021-11-25 DIAGNOSIS — E1165 Type 2 diabetes mellitus with hyperglycemia: Secondary | ICD-10-CM

## 2021-11-25 DIAGNOSIS — K219 Gastro-esophageal reflux disease without esophagitis: Secondary | ICD-10-CM | POA: Diagnosis present

## 2021-11-25 DIAGNOSIS — K7200 Acute and subacute hepatic failure without coma: Secondary | ICD-10-CM | POA: Diagnosis present

## 2021-11-25 DIAGNOSIS — I2510 Atherosclerotic heart disease of native coronary artery without angina pectoris: Secondary | ICD-10-CM | POA: Diagnosis present

## 2021-11-25 DIAGNOSIS — W06XXXA Fall from bed, initial encounter: Secondary | ICD-10-CM | POA: Diagnosis present

## 2021-11-25 LAB — CBC
HCT: 39.3 % (ref 36.0–46.0)
Hemoglobin: 13 g/dL (ref 12.0–15.0)
MCH: 30.2 pg (ref 26.0–34.0)
MCHC: 33.1 g/dL (ref 30.0–36.0)
MCV: 91.2 fL (ref 80.0–100.0)
Platelets: 169 10*3/uL (ref 150–400)
RBC: 4.31 MIL/uL (ref 3.87–5.11)
RDW: 13 % (ref 11.5–15.5)
WBC: 8.9 10*3/uL (ref 4.0–10.5)
nRBC: 0 % (ref 0.0–0.2)

## 2021-11-25 LAB — BASIC METABOLIC PANEL
Anion gap: 9 (ref 5–15)
BUN: 14 mg/dL (ref 8–23)
CO2: 26 mmol/L (ref 22–32)
Calcium: 8.5 mg/dL — ABNORMAL LOW (ref 8.9–10.3)
Chloride: 102 mmol/L (ref 98–111)
Creatinine, Ser: 0.68 mg/dL (ref 0.44–1.00)
GFR, Estimated: >60 mL/min (ref >60)
Glucose, Bld: 174 mg/dL — ABNORMAL HIGH (ref 70–99)
Potassium: 3.5 mmol/L (ref 3.5–5.1)
Sodium: 137 mmol/L (ref 135–145)

## 2021-11-25 LAB — GLUCOSE, CAPILLARY
Glucose-Capillary: 115 mg/dL — ABNORMAL HIGH (ref 70–99)
Glucose-Capillary: 141 mg/dL — ABNORMAL HIGH (ref 70–99)
Glucose-Capillary: 102 mg/dL — ABNORMAL HIGH (ref 70–99)
Glucose-Capillary: 127 mg/dL — ABNORMAL HIGH (ref 70–99)

## 2021-11-25 LAB — HEMOGLOBIN A1C
Hgb A1c MFr Bld: 6.8 % — ABNORMAL HIGH (ref 4.8–5.6)
Mean Plasma Glucose: 148.46 mg/dL

## 2021-11-25 LAB — COMPREHENSIVE METABOLIC PANEL
ALT: 53 U/L — ABNORMAL HIGH (ref 0–44)
AST: 54 U/L — ABNORMAL HIGH (ref 15–41)
Albumin: 3.1 g/dL — ABNORMAL LOW (ref 3.5–5.0)
Alkaline Phosphatase: 54 U/L (ref 38–126)
Anion gap: 8 (ref 5–15)
BUN: 14 mg/dL (ref 8–23)
CO2: 28 mmol/L (ref 22–32)
Calcium: 8.5 mg/dL — ABNORMAL LOW (ref 8.9–10.3)
Chloride: 101 mmol/L (ref 98–111)
Creatinine, Ser: 0.62 mg/dL (ref 0.44–1.00)
GFR, Estimated: >60 mL/min (ref >60)
Glucose, Bld: 136 mg/dL — ABNORMAL HIGH (ref 70–99)
Potassium: 2.6 mmol/L — CL (ref 3.5–5.1)
Sodium: 137 mmol/L (ref 135–145)
Total Bilirubin: 0.4 mg/dL (ref 0.3–1.2)
Total Protein: 6 g/dL — ABNORMAL LOW (ref 6.5–8.1)

## 2021-11-25 LAB — PROTIME-INR
INR: 1.1 (ref 0.8–1.2)
Prothrombin Time: 14 seconds (ref 11.4–15.2)

## 2021-11-25 LAB — APTT
aPTT: 29 seconds (ref 24–36)
aPTT: 29 seconds (ref 24–36)

## 2021-11-25 LAB — LACTIC ACID, PLASMA
Lactic Acid, Venous: 1.4 mmol/L (ref 0.5–1.9)
Lactic Acid, Venous: 2 mmol/L (ref 0.5–1.9)

## 2021-11-25 LAB — MAGNESIUM: Magnesium: 1.9 mg/dL (ref 1.7–2.4)

## 2021-11-25 LAB — PHOSPHORUS: Phosphorus: 2.9 mg/dL (ref 2.5–4.6)

## 2021-11-25 LAB — PROCALCITONIN: Procalcitonin: 0.25 ng/mL

## 2021-11-25 MED ORDER — INSULIN ASPART 100 UNIT/ML IJ SOLN
0.0000 [IU] | Freq: Three times a day (TID) | INTRAMUSCULAR | Status: DC
  Administered 2021-11-25: 13:00:00 2 [IU] via SUBCUTANEOUS

## 2021-11-25 MED ORDER — ENOXAPARIN SODIUM 40 MG/0.4ML IJ SOSY
40.0000 mg | PREFILLED_SYRINGE | Freq: Every day | INTRAMUSCULAR | Status: DC
  Administered 2021-11-25 – 2021-11-26 (×2): 40 mg via SUBCUTANEOUS
  Filled 2021-11-25 (×2): qty 0.4

## 2021-11-25 MED ORDER — POTASSIUM CHLORIDE IN NACL 20-0.9 MEQ/L-% IV SOLN: INTRAVENOUS | Status: DC

## 2021-11-25 MED ORDER — ONDANSETRON HCL 4 MG/2ML IJ SOLN: 4.0000 mg | Freq: Four times a day (QID) | INTRAMUSCULAR | Status: DC | PRN

## 2021-11-25 MED ORDER — LEVOTHYROXINE SODIUM 50 MCG PO TABS
50.0000 ug | ORAL_TABLET | Freq: Every day | ORAL | Status: DC
  Administered 2021-11-25 – 2021-11-26 (×2): 50 ug via ORAL
  Filled 2021-11-25 (×2): qty 1

## 2021-11-25 MED ORDER — CARVEDILOL 3.125 MG PO TABS
3.1250 mg | ORAL_TABLET | Freq: Two times a day (BID) | ORAL | Status: DC
  Administered 2021-11-25 – 2021-11-26 (×3): 3.125 mg via ORAL
  Filled 2021-11-25 (×3): qty 1

## 2021-11-25 MED ORDER — POTASSIUM CHLORIDE CRYS ER 20 MEQ PO TBCR
40.0000 meq | EXTENDED_RELEASE_TABLET | Freq: Three times a day (TID) | ORAL | Status: AC
  Administered 2021-11-25 (×3): 40 meq via ORAL
  Filled 2021-11-25 (×3): qty 2

## 2021-11-25 MED ORDER — SODIUM CHLORIDE 0.9 % IV SOLN: Freq: Once | INTRAVENOUS | Status: AC

## 2021-11-25 MED ORDER — LORATADINE 10 MG PO TABS: 10.0000 mg | ORAL_TABLET | Freq: Every day | ORAL | Status: DC | PRN

## 2021-11-25 MED ORDER — ACETAMINOPHEN 325 MG PO TABS: 650.0000 mg | ORAL_TABLET | Freq: Four times a day (QID) | ORAL | Status: DC | PRN

## 2021-11-25 MED ORDER — ORAL CARE MOUTH RINSE
15.0000 mL | Freq: Two times a day (BID) | OROMUCOSAL | Status: DC
  Administered 2021-11-25 – 2021-11-26 (×2): 15 mL via OROMUCOSAL

## 2021-11-25 MED ORDER — ASPIRIN EC 81 MG PO TBEC
81.0000 mg | DELAYED_RELEASE_TABLET | Freq: Every day | ORAL | Status: DC
  Administered 2021-11-25 – 2021-11-26 (×2): 81 mg via ORAL
  Filled 2021-11-25 (×2): qty 1

## 2021-11-25 MED ORDER — SODIUM CHLORIDE 0.9 % IV SOLN: INTRAVENOUS | Status: DC | PRN

## 2021-11-25 MED ORDER — OSELTAMIVIR PHOSPHATE 30 MG PO CAPS
30.0000 mg | ORAL_CAPSULE | Freq: Two times a day (BID) | ORAL | Status: DC
  Administered 2021-11-25 – 2021-11-26 (×3): 30 mg via ORAL
  Filled 2021-11-25 (×3): qty 1

## 2021-11-25 MED ORDER — POTASSIUM CHLORIDE 10 MEQ/100ML IV SOLN
10.0000 meq | INTRAVENOUS | Status: AC
  Administered 2021-11-25 (×2): 10 meq via INTRAVENOUS
  Filled 2021-11-25 (×2): qty 100

## 2021-11-25 MED ORDER — INSULIN ASPART 100 UNIT/ML IJ SOLN: 0.0000 [IU] | Freq: Every day | INTRAMUSCULAR | Status: DC

## 2021-11-25 MED ORDER — POTASSIUM CHLORIDE 10 MEQ/100ML IV SOLN
10.0000 meq | INTRAVENOUS | Status: AC
  Administered 2021-11-25: 15:00:00 10 meq via INTRAVENOUS
  Filled 2021-11-25: qty 100

## 2021-11-25 MED ORDER — POTASSIUM CHLORIDE 10 MEQ/100ML IV SOLN
10.0000 meq | Freq: Once | INTRAVENOUS | Status: AC
  Administered 2021-11-25: 04:00:00 10 meq via INTRAVENOUS

## 2021-11-25 NOTE — H&P (Signed)
History and Physical  Lisa Griffith YSA:[id number] DOB: Jul 26, 1946 DOA: 11/24/2021  Referring physician: Kem Parkinson, PA-C PCP: Sharilyn Sites, MD  Patient coming from: Home  Chief Complaint: Nausea and vomiting  HPI: Lisa Griffith is a 75 y.o. female with medical history significant for hypertension, type 2 diabetes mellitus, CAD, hypothyroidism, hyperlipidemia who presents to the emergency department due to 3 day onset of generalized weakness, nausea and vomiting.  Vomitus was nonbloody.  Patient was seen by her PCP 2 days ago and she was started on amoxicillin and hydrocodone.  She endorsed being so weak that she slipped out of bed with subsequent fall on the floor in the evening 2 days ago, she denies any loss of consciousness or injury.  COVID test done at PCPs office was negative.  She denies chest pain, shortness of breath  ED Course:  In the emergency department, she was febrile, tachypneic, BP was 176/73.  Work-up in the ED showed leukocytosis, hyponatremia, hypokalemia, hyperglycemia, transaminitis.  Lactic acid 2.0, urinalysis was positive for hematuria and proteinuria.  Influenza A was positive.  Influenza B and SARS coronavirus 2 was negative. Chest x-ray showed cardiomegaly, but no active disease She was treated empirically with IV ceftriaxone and azithromycin, IV hydration was provided, potassium was replenished IV Zofran was given.  Hospitalist was asked to admit patient for further evaluation and management.  Review of Systems: A full 10 point Review of Systems was done, except as stated above, all other Review of systems were negative.   Past Medical History:  Diagnosis Date   Back muscle spasm    Constipation    Coronary artery disease    GERD (gastroesophageal reflux disease)    Headache(784.0)    Hypertension    Ruptured lumbar disc    Seasonal allergies    Stroke [facility]) 2010   "mild"    Past Surgical History:  Procedure Laterality Date   CARDIAC  CATHETERIZATION  12/2008   [facility]   CATARACT EXTRACTION, BILATERAL  2008   [facility]   COLONOSCOPY  01/16/2012   Procedure: COLONOSCOPY;  Surgeon: Jamesetta So, MD;  Location[facility];  Service: Gastroenterology;  Laterality: N/A;   COLONOSCOPY     EYE SURGERY Bilateral    cataract   LUMBAR LAMINECTOMY/DECOMPRESSION MICRODISCECTOMY Right 03/01/2015   Procedure: Right L1 Hemilaminectomy, Foraminotomy;  Surgeon: Marybelle Killings, MD;  Location: [place];  Service: Orthopedics;  Laterality: Right;   POSTERIOR LAMINECTOMY / DECOMPRESSION CERVICAL SPINE     [place]    Social History:  reports that she quit smoking about 24 years ago. Her smoking use included cigarettes. She started smoking about 60 years ago. She has a 17.50 pack-year smoking history. She has never used smokeless tobacco. She reports that she does not drink alcohol and does not use drugs.   Allergies  Allergen Reactions   Hydrocodone Other (See Comments)    Hallucinations   Statins     Pt unsure of reaction     Family History  Problem Relation Age of Onset   Cancer Mother    Cancer Father     Prior to Admission medications   Medication Sig Start Date End Date Taking? Authorizing Provider  aspirin 81 MG tablet Take 81 mg by mouth daily.    [provider]  carvedilol (COREG) 3.125 MG tablet Take 3.125 mg by mouth 2 (two) times daily with a meal.    [provider]  levothyroxine (SYNTHROID) 50 MCG tablet Take 50 mcg  by mouth daily. 07/15/21   [provider]  lisinopril-hydrochlorothiazide (PRINZIDE,ZESTORETIC) 20-25 MG per tablet Take 1 tablet by mouth daily.    [provider]  loratadine (CLARITIN) 10 MG tablet Take 10 mg by mouth daily as needed for allergies.    [provider]  metFORMIN (GLUCOPHAGE) 500 MG tablet SMARTSIG:1 Tablet(s) By Mouth Morning-Evening Patient not taking: Reported on 09/08/2021 03/04/20   [provider]  naproxen sodium (ANAPROX) 220 MG  tablet Take 220 mg by mouth 2 (two) times daily with a meal. Patient not taking: Reported on 09/08/2021    [provider]  rosuvastatin (CRESTOR) 5 MG tablet Take 1 tablet (5 mg total) by mouth every Monday, Wednesday, and Friday. 09/09/21 12/08/21  Arnoldo Lenis, MD    Physical Exam: BP (!) 148/63 (BP Location: Right Arm)    Pulse 84    Temp 99.9 F (37.7 C) (Oral)    Resp 18    Ht 5\' 2"  (1.575 m)    Wt 82.4 kg    SpO2 96%    BMI 33.22 kg/m   General: 75 y.o. year-old female well developed well nourished in no acute distress.  Alert and oriented x3. HEENT: Dry mucous membrane.  NCAT, EOMI Neck: Supple, trachea medial Cardiovascular: Regular rate and rhythm with no rubs or gallops.  No thyromegaly or JVD noted.  No lower extremity edema. 2/4 pulses in all 4 extremities. Respiratory: Clear to auscultation with no wheezes or rales. Good inspiratory effort. Abdomen: Soft, nontender nondistended with normal bowel sounds x4 quadrants. Muskuloskeletal: No cyanosis, clubbing or edema noted bilaterally Neuro: CN II-XII intact, strength 5/5 x 4, sensation, reflexes intact Skin: No ulcerative lesions noted or rashes Psychiatry: Judgement and insight appear normal. Mood is appropriate for condition and setting          Labs on Admission:  Basic Metabolic Panel: Recent Labs  Lab 11/24/21 2102  NA 131*  K 2.8*  CL 94*  CO2 25  GLUCOSE 220*  BUN 18  CREATININE 0.83  CALCIUM 9.7   Liver Function Tests: Recent Labs  Lab 11/24/21 2102  AST 75*  ALT 69*  ALKPHOS 72  BILITOT 0.6  PROT 7.9  ALBUMIN 4.1   Recent Labs  Lab 11/24/21 2102  LIPASE 39   No results for input(s): AMMONIA in the last 168 hours. CBC: Recent Labs  Lab 11/24/21 2102  WBC 12.0*  HGB 16.1*  HCT 48.2*  MCV 88.9  PLT 221   Cardiac Enzymes: No results for input(s): CKTOTAL, CKMB, CKMBINDEX, TROPONINI in the last 168 hours.  BNP (last 3 results) No results for input(s): BNP in the last  8760 hours.  ProBNP (last 3 results) No results for input(s): PROBNP in the last 8760 hours.  CBG: No results for input(s): GLUCAP in the last 168 hours.  Radiological Exams on Admission: DG Chest Portable 1 View  Result Date: 11/24/2021 CLINICAL DATA:  Cough and fever EXAM: PORTABLE CHEST 1 VIEW COMPARISON:  12/18/2013 FINDINGS: Mild cardiomegaly. No consolidation. Aortic atherosclerosis. Hardware in the cervical spine. IMPRESSION: No active disease.  Mild cardiomegaly Electronically Signed   By: Donavan Foil M.D.   On: 11/24/2021 23:09    EKG: I independently viewed the EKG done and my findings are as followed: Normal sinus rhythm at a rate of 85 bpm  Assessment/Plan Present on Admission:  Influenza A  Principal Problem:   Influenza A Active Problems:   Generalized weakness   Nausea & vomiting  Hypokalemia   Fall at home, initial encounter   Leukocytosis   Hyponatremia   Hyperglycemia due to diabetes mellitus (HCC)   Transaminitis   Lactic acidosis   Dehydration   Generalized weakness secondary to influenza A, rule out concomitant CAP POA Continue Tamiflu Continue Tylenol as needed Chest x-ray showed no active disease, patient was empirically started on IV ceftriaxone and azithromycin Procalcitonin will be checked prior to continuing with IV antibiotics Continue fall precaution and neurochecks  Nausea and vomiting in the setting of above Continue Zofran as needed  Fall at home due to generalized weakness Continue fall precaution and neurochecks Continue PT/OT eval and treat  Lactic acidosis possibly secondary to multifactorial Lactic acid 2.0, continue to trend lactic acid Continue gentle hydration  Leukocytosis WBC 12.0, continue management as described for influenza A above  Hyponatremia/dehydration Na 131, this is possibly secondary to diuretic (HCTZ) effect Continue IV hydration  Hypokalemia K+ is 2.8 K+ will be replenished Please monitor for AM  K+ for further replenishmemnt  Transaminitis possibly secondary to shock liver AST 75, ALT 69, patient denies any abdominal pain Continue to monitor liver enzymes  Hyperglycemia secondary to T2DM Continue insulin sliding scale and hypoglycemia protocol Metformin will be held at this time  Hypothyroidism Continue Synthroid  Mixed hyperlipidemia Crestor will be held at this time due to elevated liver enzymes  Essential hypertension Continue Coreg and lisinopril HCTZ will be held at this time due to dehydration  DVT prophylaxis: Lovenox  Code Status: Full code  Family Communication: None at bedside  Disposition Plan:  Patient is from:                        home Anticipated DC to:                   SNF or family members home Anticipated DC date:               2-3 days Anticipated DC barriers:          Patient requires inpatient management due to influenza A and nausea and vomiting  Consults called: None  Admission status: Inpatient    Bernadette Hoit MD [facility]  11/25/2021, 3:11 AM

## 2021-11-25 NOTE — ED Notes (Signed)
Prior to transport upstairs to 300 floor, Pt ambulated to restroom without assistance.

## 2021-11-25 NOTE — Progress Notes (Signed)
Lisa Griffith  SHF:[id number] DOB: December 27, 1945 DOA: 11/24/2021 PCP: Sharilyn Sites, MD    Brief Narrative:  75 year old with a history of HTN, DM2, CAD, hypothyroidism, and HLD who presented to the ED with 3 days of severe generalized weakness nausea and vomiting.  The patient was seen by her PCP 2 days prior to presentation and given prescriptions for amoxicillin and hydrocodone.  Despite this her symptoms persisted.  COVID testing was negative at the patient's PCP office.  Work-up in the ED revealed leukocytosis, fever, hyponatremia, hypokalemia, hyperglycemia, and transaminitis.  Influenza A was positive.  CXR was without focal infiltrate.  Consultants:  None  Code Status: FULL CODE  Antimicrobials:  Rocephin 12/29 Azithromycin 12/29  DVT prophylaxis: Lovenox  Subjective: Patient was interviewed and examined by one of my partners earlier today.  Assessment & Plan:  Influenza A Tamiflu initiated - pt was immunized against flu this year - sx improving already   Nausea and vomiting with subsequent dehydration lactic acidosis and hyponatremia Due to above - IV fluid support - sodium has normalized  Severe hypokalemia Due to poor oral intake, use of diuretic, and probable GI losses -magnesium normal - continue to supplement and follow closely - keep on tele until K+ >3.2  Fall at home due to generalized weakness PT/OT evaluations - likely simply situational in setting of DH   Transaminitis Likely low-grade shock liver due to hypotension noted above - monitor with volume resuscitation  Recent Labs  Lab 11/24/21 2102 11/25/21 0439  AST 75* 54*  ALT 69* 53*  ALKPHOS 72 54  BILITOT 0.6 0.4  PROT 7.9 6.0*  ALBUMIN 4.1 3.1*    DM2 with hyperglycemia CBG controlled at present   Hypothyroidism Continue home Synthroid dose  Hyperlipidemia Holding Crestor due to transaminitis  Essential HTN Continue Coreg -hold ACE inhibitor and diuretic while patient is  dehydrated   Family Communication: no family present at time of exam  Status is: Inpatient   Objective: Blood pressure 130/60, pulse 69, temperature 98 F (36.7 C), temperature source Oral, resp. rate 20, height 5\' 2"  (1.575 m), weight 82.4 kg, SpO2 98 %.  Intake/Output Summary (Last 24 hours) at 11/25/2021 0744 Last data filed at 11/25/2021 5027 Gross per 24 hour  Intake 2518.59 ml  Output --  Net 2518.59 ml   Filed Weights   11/24/21 2100 11/25/21 0122  Weight: 82.6 kg 82.4 kg    Examination: Patient was examined by one of my partners earlier today  CBC: Recent Labs  Lab 11/24/21 2102 11/25/21 0439  WBC 12.0* 8.9  HGB 16.1* 13.0  HCT 48.2* 39.3  MCV 88.9 91.2  PLT 221 741   Basic Metabolic Panel: Recent Labs  Lab 11/24/21 2102 11/25/21 0439  NA 131* 137  K 2.8* 2.6*  CL 94* 101  CO2 25 28  GLUCOSE 220* 136*  BUN 18 14  CREATININE 0.83 0.62  CALCIUM 9.7 8.5*  MG  --  1.9  PHOS  --  2.9   GFR: Estimated Creatinine Clearance: 59.5 mL/min (by C-G formula based on SCr of 0.62 mg/dL).  Liver Function Tests: Recent Labs  Lab 11/24/21 2102 11/25/21 0439  AST 75* 54*  ALT 69* 53*  ALKPHOS 72 54  BILITOT 0.6 0.4  PROT 7.9 6.0*  ALBUMIN 4.1 3.1*   Recent Labs  Lab 11/24/21 2102  LIPASE 39    Coagulation Profile: Recent Labs  Lab 11/24/21 2341  INR 1.1    Recent Results (from the past  240 hour(s))  Resp Panel by RT-PCR (Flu A&B, Covid) Nasopharyngeal Swab     Status: Abnormal   Collection Time: 11/24/21 10:31 PM   Specimen: Nasopharyngeal Swab; Nasopharyngeal(NP) swabs in vial transport medium  Result Value Ref Range Status   SARS Coronavirus 2 by RT PCR NEGATIVE NEGATIVE Final    Comment: (NOTE) SARS-CoV-2 target nucleic acids are NOT DETECTED.  The SARS-CoV-2 RNA is generally detectable in upper respiratory specimens during the acute phase of infection. The lowest concentration of SARS-CoV-2 viral copies this assay can detect is 138  copies/mL. A negative result does not preclude SARS-Cov-2 infection and should not be used as the sole basis for treatment or other patient management decisions. A negative result may occur with  improper specimen collection/handling, submission of specimen other than nasopharyngeal swab, presence of viral mutation(s) within the areas targeted by this assay, and inadequate number of viral copies(<138 copies/mL). A negative result must be combined with clinical observations, patient history, and epidemiological information. The expected result is Negative.  Fact Sheet for Patients:  EntrepreneurPulse.com.au  Fact Sheet for Healthcare Providers:  IncredibleEmployment.be  This test is no t yet approved or cleared by the [place] FDA and  has been authorized for detection and/or diagnosis of SARS-CoV-2 by FDA under an Emergency Use Authorization (EUA). This EUA will remain  in effect (meaning this test can be used) for the duration of the COVID-19 declaration under Section 564(b)(1) of the Act, 21 U.S.C.section 360bbb-3(b)(1), unless the authorization is terminated  or revoked sooner.       Influenza A by PCR POSITIVE (A) NEGATIVE Final   Influenza B by PCR NEGATIVE NEGATIVE Final    Comment: (NOTE) The Xpert Xpress SARS-CoV-2/FLU/RSV plus assay is intended as an aid in the diagnosis of influenza from Nasopharyngeal swab specimens and should not be used as a sole basis for treatment. Nasal washings and aspirates are unacceptable for Xpert Xpress SARS-CoV-2/FLU/RSV testing.  Fact Sheet for Patients: EntrepreneurPulse.com.au  Fact Sheet for Healthcare Providers: IncredibleEmployment.be  This test is not yet approved or cleared by the [place] FDA and has been authorized for detection and/or diagnosis of SARS-CoV-2 by FDA under an Emergency Use Authorization (EUA). This EUA will remain in effect  (meaning this test can be used) for the duration of the COVID-19 declaration under Section 564(b)(1) of the Act, 21 U.S.C. section 360bbb-3(b)(1), unless the authorization is terminated or revoked.  Performed at [facility], [address], [place], [place] [address]   Blood Culture (routine x 2)     Status: None (Preliminary result)   Collection Time: 11/24/21 11:41 PM   Specimen: BLOOD  Result Value Ref Range Status   Specimen Description BLOOD BLOOD RIGHT HAND  Final   Special Requests   Final    BOTTLES DRAWN AEROBIC AND ANAEROBIC Blood Culture adequate volume   Culture   Final    NO GROWTH < 12 HOURS Performed at [facility], [address], [place], [place] [address]    Report Status PENDING  Incomplete  Blood Culture (routine x 2)     Status: None (Preliminary result)   Collection Time: 11/24/21 11:41 PM   Specimen: BLOOD  Result Value Ref Range Status   Specimen Description BLOOD BLOOD LEFT HAND  Final   Special Requests   Final    BOTTLES DRAWN AEROBIC AND ANAEROBIC Blood Culture adequate volume   Culture   Final    NO GROWTH < 12 HOURS Performed at [facility], [address], [place], [place] [address]    Report Status PENDING  Incomplete     Scheduled Meds:  enoxaparin (LOVENOX) injection  40 mg Subcutaneous Daily   insulin aspart  0-15 Units Subcutaneous TID WC   insulin aspart  0-5 Units Subcutaneous QHS   mouth rinse  15 mL Mouth Rinse BID   oseltamivir  30 mg Oral BID   Continuous Infusions:  sodium chloride 10 mL/hr at 11/25/21 0129     LOS: 0 days   Cherene Altes, MD [facility] Office  [phone] Pager - Text Page per Shea Evans  If 7PM-7AM, please contact night-coverage per Amion 11/25/2021, 7:44 AM

## 2021-11-25 NOTE — ED Notes (Signed)
Date and time results received: 11/25/21 0041   Test: LACTIC Critical Value: 2.0  Name of Provider Notified: Josephine Cables, DO

## 2021-11-25 NOTE — Evaluation (Signed)
Physical Therapy Evaluation Patient Details Name: Lisa Griffith MRN: [id number] DOB: 11/13/1946 Today's Date: 11/25/2021  History of Present Illness  Lisa Griffith is a 75 y.o. female with medical history significant for hypertension, type 2 diabetes mellitus, CAD, hypothyroidism, hyperlipidemia who presents to the emergency department due to 3 day onset of generalized weakness, nausea and vomiting.  Vomitus was nonbloody.  Patient was seen by her PCP 2 days ago and she was started on amoxicillin and hydrocodone.  She endorsed being so weak that she slipped out of bed with subsequent fall on the floor in the evening 2 days ago, she denies any loss of consciousness or injury.  COVID test done at PCPs office was negative.  She denies chest pain, shortness of breath   Clinical Impression  Patient does not require assist with bed mobility or transfer to standing without AD. Patient slightly unsteady upon standing and intermittently with ambulation without AD. Patient does not have loss of balance and is given cueing for posture with ambulation with fair carry over. Patient returned to bed at end of session. Patient discharged to care of nursing for ambulation daily as tolerated for length of stay.        Recommendations for follow up therapy are one component of a multi-disciplinary discharge planning process, led by the attending physician.  Recommendations may be updated based on patient status, additional functional criteria and insurance authorization.  Follow Up Recommendations No PT follow up    Assistance Recommended at Discharge PRN  Functional Status Assessment Patient has had a recent decline in their functional status and demonstrates the ability to make significant improvements in function in a reasonable and predictable amount of time.  Equipment Recommendations  None recommended by PT    Recommendations for Other Services       Precautions / Restrictions  Precautions Precautions: Fall Restrictions Weight Bearing Restrictions: No      Mobility  Bed Mobility Overal bed mobility: Independent                  Transfers Overall transfer level: Modified independent Equipment used: None               General transfer comment: slightly slow, minimally undsteady upon standing    Ambulation/Gait Ambulation/Gait assistance: Supervision;Min guard Gait Distance (Feet): 150 Feet Assistive device: None Gait Pattern/deviations: Drifts right/left;Trunk flexed       General Gait Details: intermittent drifting, intermittent unsteadiness without loss of balance  Stairs            Wheelchair Mobility    Modified Rankin (Stroke Patients Only)       Balance Overall balance assessment: Mild deficits observed, not formally tested                                           Pertinent Vitals/Pain Pain Assessment: No/denies pain    Home Living Family/patient expects to be discharged to:: Private residence Living Arrangements: Spouse/significant other Available Help at Discharge: Family Type of Home: House Home Access: Stairs to enter Entrance Stairs-Rails: Right;Left;Can reach both Entrance Stairs-Number of Steps: 3 Alternate Level Stairs-Number of Steps: doesnt use Home Layout: Two level;Laundry or work area in [place]: Conservation officer, nature (2 wheels);Cane - single point;Shower seat;BSC/3in1      Prior Function Prior Level of Function : Independent/Modified Independent  Mobility Comments: patient states short distance community ambulation       Hand Dominance        Extremity/Trunk Assessment   Upper Extremity Assessment Upper Extremity Assessment: Defer to OT evaluation    Lower Extremity Assessment Lower Extremity Assessment: Overall WFL for tasks assessed       Communication   Communication: No difficulties  Cognition Arousal/Alertness:  Awake/alert Behavior During Therapy: WFL for tasks assessed/performed Overall Cognitive Status: Within Functional Limits for tasks assessed                                          General Comments      Exercises     Assessment/Plan    PT Assessment Patient does not need any further PT services  PT Problem List         PT Treatment Interventions      PT Goals (Current goals can be found in the Care Plan section)  Acute Rehab PT Goals Patient Stated Goal: return home PT Goal Formulation: With patient Time For Goal Achievement: 11/25/21 Potential to Achieve Goals: Good    Frequency     Barriers to discharge        Co-evaluation               AM-PAC PT "6 Clicks" Mobility  Outcome Measure Help needed turning from your back to your side while in a flat bed without using bedrails?: None Help needed moving from lying on your back to sitting on the side of a flat bed without using bedrails?: None Help needed moving to and from a bed to a chair (including a wheelchair)?: None Help needed standing up from a chair using your arms (e.g., wheelchair or bedside chair)?: None Help needed to walk in hospital room?: None Help needed climbing 3-5 steps with a railing? : A Little 6 Click Score: 23    End of Session   Activity Tolerance: Patient tolerated treatment well Patient left: in bed;with call bell/phone within reach Nurse Communication: Mobility status PT Visit Diagnosis: Unsteadiness on feet (R26.81);Other abnormalities of gait and mobility (R26.89)    Time: 8185-6314 PT Time Calculation (min) (ACUTE ONLY): 9 min   Charges:   PT Evaluation $PT Eval Low Complexity: 1 Low          11:55 AM, 11/25/21 Mearl Latin PT, DPT Physical Therapist at [facility]

## 2021-11-26 LAB — URINE CULTURE: Culture: NO GROWTH

## 2021-11-26 LAB — COMPREHENSIVE METABOLIC PANEL
ALT: 48 U/L — ABNORMAL HIGH (ref 0–44)
AST: 50 U/L — ABNORMAL HIGH (ref 15–41)
Albumin: 3 g/dL — ABNORMAL LOW (ref 3.5–5.0)
Alkaline Phosphatase: 49 U/L (ref 38–126)
Anion gap: 8 (ref 5–15)
BUN: 14 mg/dL (ref 8–23)
CO2: 26 mmol/L (ref 22–32)
Calcium: 8.6 mg/dL — ABNORMAL LOW (ref 8.9–10.3)
Chloride: 106 mmol/L (ref 98–111)
Creatinine, Ser: 0.6 mg/dL (ref 0.44–1.00)
GFR, Estimated: >60 mL/min (ref >60)
Glucose, Bld: 100 mg/dL — ABNORMAL HIGH (ref 70–99)
Potassium: 4.5 mmol/L (ref 3.5–5.1)
Sodium: 140 mmol/L (ref 135–145)
Total Bilirubin: 0.5 mg/dL (ref 0.3–1.2)
Total Protein: 5.6 g/dL — ABNORMAL LOW (ref 6.5–8.1)

## 2021-11-26 LAB — MAGNESIUM: Magnesium: 1.9 mg/dL (ref 1.7–2.4)

## 2021-11-26 LAB — CBC
HCT: 38.8 % (ref 36.0–46.0)
Hemoglobin: 12.5 g/dL (ref 12.0–15.0)
MCH: 30.3 pg (ref 26.0–34.0)
MCHC: 32.2 g/dL (ref 30.0–36.0)
MCV: 93.9 fL (ref 80.0–100.0)
Platelets: 143 10*3/uL — ABNORMAL LOW (ref 150–400)
RBC: 4.13 MIL/uL (ref 3.87–5.11)
RDW: 13.2 % (ref 11.5–15.5)
WBC: 5.7 10*3/uL (ref 4.0–10.5)
nRBC: 0 % (ref 0.0–0.2)

## 2021-11-26 LAB — PROCALCITONIN: Procalcitonin: 0.15 ng/mL

## 2021-11-26 LAB — GLUCOSE, CAPILLARY: Glucose-Capillary: 113 mg/dL — ABNORMAL HIGH (ref 70–99)

## 2021-11-26 MED ORDER — LISINOPRIL-HYDROCHLOROTHIAZIDE 20-25 MG PO TABS: 1.0000 | ORAL_TABLET | Freq: Every day | ORAL | Status: DC

## 2021-11-26 MED ORDER — OSELTAMIVIR PHOSPHATE 30 MG PO CAPS
30.0000 mg | ORAL_CAPSULE | Freq: Two times a day (BID) | ORAL | 0 refills | Status: AC
Start: 2021-11-26 — End: 2021-11-28

## 2021-11-26 NOTE — Discharge Summary (Signed)
DISCHARGE SUMMARY  Lisa Griffith  MR#: [id number]  DOB:1945/12/06  Date of Admission: 11/24/2021 Date of Discharge: 11/26/2021  Attending Physician:Denvil Canning Hennie Duos, MD  Patient's OYD:XAJOINO, Lisa Reichmann, MD  Consults: none   Disposition: D/C home   Follow-up Appts:  Follow-up Information     Sharilyn Sites, MD Follow up in 1 week(s).   Specialty: Family Medicine Contact information: [address] [place] [address] [phone]                Follow-up issues: -recheck LFTs -determine if Crestor should be resumed -f/u renal function and K+ w/ resumption of ACEi/diuretic   Discharge Diagnoses: Influenza A Nausea and vomiting Dehydration Lactic acidosis Hyponatremia Severe hypokalemia Fall at home due to generalized weakness Transaminitis DM2 with hyperglycemia Hypothyroidism Hyperlipidemia Essential HTN   Initial presentation: 75 year old with a history of HTN, DM2, CAD, hypothyroidism, and HLD who presented to the ED with 3 days of severe generalized weakness nausea and vomiting.  The patient was seen by her PCP 2 days prior to presentation and given prescriptions for amoxicillin and hydrocodone.  Despite this her symptoms persisted.  COVID testing was negative at the patient's PCP office.  Work-up in the ED revealed leukocytosis, fever, hyponatremia, hypokalemia, hyperglycemia, and transaminitis.  Influenza A was positive.  CXR was without focal infiltrate.  Hospital Course:  Influenza A Tamiflu initiated - pt was immunized against flu this year - sx improved quickly - to complete her course of Tamiflu as an outpatient    Nausea and vomiting with subsequent dehydration lactic acidosis and hyponatremia Due to above - IV fluid support provided - sodium has normalized - sx resolved    Severe hypokalemia Due to poor oral intake, use of diuretic, and probable GI losses - magnesium normal -has been supplemented back to a normal range -  continue to hold diuretic for another 3 days   Fall at home due to generalized weakness likely simply situational in setting of DH - did well with therapy evaluations and does not need further home health assistance   Transaminitis Likely low-grade shock liver due to hypotension noted above -slowly improving with volume resuscitation   DM2 with hyperglycemia CBG controlled at this time   Hypothyroidism Continue home Synthroid dose   Hyperlipidemia Holding Crestor due to transaminitis - f/u as outpatient - pt states she had actually stopped taking this before her admission    Essential HTN Continue Coreg -hold ACE inhibitor and diuretic while patient is dehydrated    Allergies as of 11/26/2021       Reactions   Hydrocodone Other (See Comments)   Hallucinations   Statins    Pt unsure of reaction         Medication List     STOP taking these medications    rosuvastatin 5 MG tablet Commonly known as: CRESTOR       TAKE these medications    aspirin 81 MG tablet Take 81 mg by mouth daily.   carvedilol 3.125 MG tablet Commonly known as: COREG Take 3.125 mg by mouth 2 (two) times daily with a meal.   levothyroxine 50 MCG tablet Commonly known as: SYNTHROID Take 50 mcg by mouth daily.   lisinopril-hydrochlorothiazide 20-25 MG tablet Commonly known as: ZESTORETIC Take 1 tablet by mouth daily. Start taking on: November 30, 2021 What changed: These instructions start on November 30, 2021. If you are unsure what to do until then, ask your doctor or other care provider.   loratadine 10 MG  tablet Commonly known as: CLARITIN Take 10 mg by mouth daily as needed for allergies.   oseltamivir 30 MG capsule Commonly known as: TAMIFLU Take 1 capsule (30 mg total) by mouth 2 (two) times daily for 5 doses.        Day of Discharge BP (!) 154/84    Pulse 74    Temp 98.1 F (36.7 C) (Oral)    Resp 20    Ht 5\' 2"  (1.575 m)    Wt 82.4 kg    SpO2 93%    BMI 33.22 kg/m    Physical Exam: General: No acute respiratory distress Lungs: Clear to auscultation bilaterally without wheezes or crackles Cardiovascular: Regular rate and rhythm without murmur gallop or rub normal S1 and S2 Abdomen: Nontender, nondistended, soft, bowel sounds positive, no rebound, no ascites, no appreciable mass Extremities: No significant cyanosis, clubbing, or edema bilateral lower extremities  Basic Metabolic Panel: Recent Labs  Lab 11/24/21 2102 11/25/21 0439 11/25/21 1335 11/26/21 0413  NA 131* 137 137 140  K 2.8* 2.6* 3.5 4.5  CL 94* 101 102 106  CO2 25 28 26 26   GLUCOSE 220* 136* 174* 100*  BUN 18 14 14 14   CREATININE 0.83 0.62 0.68 0.60  CALCIUM 9.7 8.5* 8.5* 8.6*  MG  --  1.9  --  1.9  PHOS  --  2.9  --   --     Liver Function Tests: Recent Labs  Lab 11/24/21 2102 11/25/21 0439 11/26/21 0413  AST 75* 54* 50*  ALT 69* 53* 48*  ALKPHOS 72 54 49  BILITOT 0.6 0.4 0.5  PROT 7.9 6.0* 5.6*  ALBUMIN 4.1 3.1* 3.0*   Recent Labs  Lab 11/24/21 2102  LIPASE 39    Coags: Recent Labs  Lab 11/24/21 2341  INR 1.1    CBC: Recent Labs  Lab 11/24/21 2102 11/25/21 0439 11/26/21 0413  WBC 12.0* 8.9 5.7  HGB 16.1* 13.0 12.5  HCT 48.2* 39.3 38.8  MCV 88.9 91.2 93.9  PLT 221 169 143*    CBG: Recent Labs  Lab 11/25/21 0742 11/25/21 1158 11/25/21 1654 11/25/21 2052 11/26/21 0712  GLUCAP 115* 141* 102* 127* 113*    Recent Results (from the past 240 hour(s))  Urine Culture     Status: None   Collection Time: 11/24/21  9:02 PM   Specimen: Urine, Clean Catch  Result Value Ref Range Status   Specimen Description   Final    URINE, CLEAN CATCH Performed at [facility], [address] [place], [place] [address]    Special Requests   Final    NONE Performed at [facility], [address] [place], [place] [address]    Culture   Final    NO GROWTH Performed at [facility], [address] [place], [place] [address]    Report Status  11/26/2021 FINAL  Final  Resp Panel by RT-PCR (Flu A&B, Covid) Nasopharyngeal Swab     Status: Abnormal   Collection Time: 11/24/21 10:31 PM   Specimen: Nasopharyngeal Swab; Nasopharyngeal(NP) swabs in vial transport medium  Result Value Ref Range Status   SARS Coronavirus 2 by RT PCR NEGATIVE NEGATIVE Final    Comment: (NOTE) SARS-CoV-2 target nucleic acids are NOT DETECTED.  The SARS-CoV-2 RNA is generally detectable in upper respiratory specimens during the acute phase of infection. The lowest concentration of SARS-CoV-2 viral copies this assay can detect is 138 copies/mL. A negative result does not preclude SARS-Cov-2 infection and should not  be used as the sole basis for treatment or other patient management decisions. A negative result may occur with  improper specimen collection/handling, submission of specimen other than nasopharyngeal swab, presence of viral mutation(s) within the areas targeted by this assay, and inadequate number of viral copies(<138 copies/mL). A negative result must be combined with clinical observations, patient history, and epidemiological information. The expected result is Negative.  Fact Sheet for Patients:  EntrepreneurPulse.com.au  Fact Sheet for Healthcare Providers:  IncredibleEmployment.be  This test is no t yet approved or cleared by the [place] FDA and  has been authorized for detection and/or diagnosis of SARS-CoV-2 by FDA under an Emergency Use Authorization (EUA). This EUA will remain  in effect (meaning this test can be used) for the duration of the COVID-19 declaration under Section 564(b)(1) of the Act, 21 U.S.C.section 360bbb-3(b)(1), unless the authorization is terminated  or revoked sooner.       Influenza A by PCR POSITIVE (A) NEGATIVE Final   Influenza B by PCR NEGATIVE NEGATIVE Final    Comment: (NOTE) The Xpert Xpress SARS-CoV-2/FLU/RSV plus assay is intended as an aid in the  diagnosis of influenza from Nasopharyngeal swab specimens and should not be used as a sole basis for treatment. Nasal washings and aspirates are unacceptable for Xpert Xpress SARS-CoV-2/FLU/RSV testing.  Fact Sheet for Patients: EntrepreneurPulse.com.au  Fact Sheet for Healthcare Providers: IncredibleEmployment.be  This test is not yet approved or cleared by the [place] FDA and has been authorized for detection and/or diagnosis of SARS-CoV-2 by FDA under an Emergency Use Authorization (EUA). This EUA will remain in effect (meaning this test can be used) for the duration of the COVID-19 declaration under Section 564(b)(1) of the Act, 21 U.S.C. section 360bbb-3(b)(1), unless the authorization is terminated or revoked.  Performed at [facility], [address] [place], [place] [address]   Blood Culture (routine x 2)     Status: None (Preliminary result)   Collection Time: 11/24/21 11:41 PM   Specimen: BLOOD  Result Value Ref Range Status   Specimen Description BLOOD BLOOD RIGHT HAND  Final   Special Requests   Final    BOTTLES DRAWN AEROBIC AND ANAEROBIC Blood Culture adequate volume   Culture   Final    NO GROWTH < 12 HOURS Performed at [facility], [address] [place], [place] [address]    Report Status PENDING  Incomplete  Blood Culture (routine x 2)     Status: None (Preliminary result)   Collection Time: 11/24/21 11:41 PM   Specimen: BLOOD  Result Value Ref Range Status   Specimen Description BLOOD BLOOD LEFT HAND  Final   Special Requests   Final    BOTTLES DRAWN AEROBIC AND ANAEROBIC Blood Culture adequate volume   Culture   Final    NO GROWTH < 12 HOURS Performed at [facility], [address], [place], [place] [address]    Report Status PENDING  Incomplete     Time spent in discharge (includes decision making & examination of pt): 35 minutes  11/26/2021, 10:01 AM   Lisa Altes, MD [facility] Office  [phone]

## 2021-11-29 LAB — CULTURE, BLOOD (ROUTINE X 2)
Culture: NO GROWTH
Culture: NO GROWTH
Special Requests: ADEQUATE
Special Requests: ADEQUATE

## 2021-12-01 DIAGNOSIS — E876 Hypokalemia: Secondary | ICD-10-CM | POA: Diagnosis not present

## 2021-12-01 DIAGNOSIS — R748 Abnormal levels of other serum enzymes: Secondary | ICD-10-CM | POA: Diagnosis not present

## 2021-12-08 ENCOUNTER — Other Ambulatory Visit: Payer: Self-pay

## 2021-12-08 ENCOUNTER — Encounter: Payer: Self-pay | Admitting: Orthopaedic Surgery

## 2021-12-08 ENCOUNTER — Ambulatory Visit: Payer: Self-pay

## 2021-12-08 ENCOUNTER — Ambulatory Visit: Payer: Medicare Other | Admitting: Orthopaedic Surgery

## 2021-12-08 VITALS — Ht 62.0 in | Wt 181.0 lb

## 2021-12-08 DIAGNOSIS — M542 Cervicalgia: Secondary | ICD-10-CM | POA: Diagnosis not present

## 2021-12-08 NOTE — Progress Notes (Signed)
Office Visit Note   Patient: Lisa Griffith           Date of Birth: [birth year]           MRN: [id number] Visit Date: 12/08/2021              Requested by: Sharilyn Sites, [address] [place],  [place] [address] PCP: Sharilyn Sites, MD   Assessment & Plan: Visit Diagnoses:  1. Neck pain   2. Cervicalgia     Plan: 17 years post three-level fusion with progressive facet degenerative changes above or below the fusion.  We will proceed with an MRI scan to evaluate her for her neck and radicular left side symptoms are radiating to her shoulders.  Office follow-up after MRI scan.  Follow-Up Instructions: No follow-ups on file.   Orders:  Orders Placed This Encounter  Procedures   XR Cervical Spine 2 or 3 views   MR Cervical Spine w/o contrast   No orders of the defined types were placed in this encounter.     Procedures: No procedures performed   Clinical Data: No additional findings.   Subjective: Chief Complaint  Patient presents with   Neck - Pain    HPI 76 year old female is seen with progressive neck symptoms that radiates from the neck into the left shoulder.  She states she woke up a few months ago could barely move her neck she has used a TENS unit and also Tylenol.  Previous three-level cervical fusion C4-C7 done by Dr. Ellene Route in 2006 which she will solid.  She states she has had dysphagia problems since her 2006 surgery.  Plain radiographs obtained today shows degenerative facet changes above and below her fusion with solid 3 level cervical fusion.  Review of Systems negative for myelopathic changes.  Previous lumbar surgery by me 2016 doing well.  Negative fever chills no chest pain negative for CVA.  Positive for type 2 diabetes.   Objective: Vital Signs: Ht 5\' 2"  (1.575 m)    Wt 181 lb (82.1 kg)    BMI 33.11 kg/m   Physical Exam Constitutional:      Appearance: She is well-developed.  HENT:     Head: Normocephalic.     Right Ear:  External ear normal.     Left Ear: External ear normal. There is no impacted cerumen.  Eyes:     Pupils: Pupils are equal, round, and reactive to light.  Neck:     Thyroid: No thyromegaly.     Trachea: No tracheal deviation.  Cardiovascular:     Rate and Rhythm: Normal rate.  Pulmonary:     Effort: Pulmonary effort is normal.  Abdominal:     Palpations: Abdomen is soft.  Musculoskeletal:     Cervical back: No rigidity.  Skin:    General: Skin is warm and dry.  Neurological:     Mental Status: She is alert and oriented to person, place, and time.  Psychiatric:        Behavior: Behavior normal.    Ortho Exam patient has brachial plexus tenderness worse on the left than right side.  Upper extremity reflexes are 2+ and symmetrical.  Healed anterior cervical incision.  Specialty Comments:  No specialty comments available.  Imaging: XR Cervical Spine 2 or 3 views  Result Date: 12/09/2021 AP lateral cervical spine images demonstrates 3 level anterior cervical fusion with cages and good consolidation all 3 levels from C4-C7.  There is facet degenerative changes at C3-4 and  also C7-T1 adjacent to the solid fusion. Impression: 3 level cervical fusion C4-C7 solid with adjacent facet degenerative changes.    PMFS History: Patient Active Problem List   Diagnosis Date Noted   Hyperglycemia due to diabetes mellitus ([place]) 11/25/2021   Essential hypertension 11/25/2021   Adult hypothyroidism 11/25/2021   Mixed hyperlipidemia 11/25/2021   S/P lumbar discectomy 03/01/2015   Sacroiliac dysfunction 09/02/2013   Low back pain 09/02/2013   Past Medical History:  Diagnosis Date   Back muscle spasm    Constipation    Coronary artery disease    GERD (gastroesophageal reflux disease)    Headache(784.0)    Hypertension    Ruptured lumbar disc    Seasonal allergies    Stroke [facility]) 2010   "mild"     Family History  Problem Relation Age of Onset   Cancer Mother    Cancer Father      Past Surgical History:  Procedure Laterality Date   CARDIAC CATHETERIZATION  12/2008   [facility]   CATARACT EXTRACTION, BILATERAL  2008   [facility]   COLONOSCOPY  01/16/2012   Procedure: COLONOSCOPY;  Surgeon: Jamesetta So, MD;  [facility];  Service: Gastroenterology;  Laterality: N/A;   COLONOSCOPY     EYE SURGERY Bilateral    cataract   LUMBAR LAMINECTOMY/DECOMPRESSION MICRODISCECTOMY Right 03/01/2015   Procedure: Right L1 Hemilaminectomy, Foraminotomy;  Surgeon: Marybelle Killings, MD;  Location: [place];  Service: Orthopedics;  Laterality: Right;   POSTERIOR LAMINECTOMY / DECOMPRESSION CERVICAL SPINE     [facility]   Social History   Occupational History   Not on file  Tobacco Use   Smoking status: Former    Packs/day: 0.50    Years: 35.00    Pack years: 17.50    Types: Cigarettes    Start date: 11/08/1961    Quit date: 01/26/1997    Years since quitting: 24.8   Smokeless tobacco: Never  Vaping Use   Vaping Use: Never used  Substance and Sexual Activity   Alcohol use: No    Alcohol/week: 0.0 standard drinks    Comment: occasional wine/beer   Drug use: No   Sexual activity: Not on file

## 2021-12-09 ENCOUNTER — Other Ambulatory Visit: Payer: Self-pay

## 2021-12-09 ENCOUNTER — Encounter: Payer: Self-pay | Admitting: Cardiology

## 2021-12-09 ENCOUNTER — Ambulatory Visit: Payer: Medicare Other | Admitting: Cardiology

## 2021-12-09 ENCOUNTER — Telehealth: Payer: Self-pay | Admitting: Cardiology

## 2021-12-09 VITALS — BP 130/90 | HR 71 | Ht 62.0 in | Wt 180.6 lb

## 2021-12-09 DIAGNOSIS — E782 Mixed hyperlipidemia: Secondary | ICD-10-CM | POA: Diagnosis not present

## 2021-12-09 DIAGNOSIS — I2510 Atherosclerotic heart disease of native coronary artery without angina pectoris: Secondary | ICD-10-CM

## 2021-12-09 DIAGNOSIS — I10 Essential (primary) hypertension: Secondary | ICD-10-CM

## 2021-12-09 NOTE — Patient Instructions (Addendum)
Medication Instructions:  Your physician recommends that you continue on your current medications as directed. Please refer to the Current Medication list given to you today.  *If you need a refill on your cardiac medications before your next appointment, please call your pharmacy*   Lab Work: IN SIX WEEKS FLP CMET If you have labs (blood work) drawn today and your tests are completely normal, you will receive your results only by: [place] (if you have MyChart) OR A paper copy in the mail If you have any lab test that is abnormal or we need to change your treatment, we will call you to review the results.   Testing/Procedures: None   Follow-Up: At [facility], you and your health needs are our priority.  As part of our continuing mission to provide you with exceptional heart care, we have created designated Provider Care Teams.  These Care Teams include your primary Cardiologist (physician) and Advanced Practice Providers (APPs -  Physician Assistants and Nurse Practitioners) who all work together to provide you with the care you need, when you need it.  We recommend signing up for the patient portal called "MyChart".  Sign up information is provided on this After Visit Summary.  MyChart is used to connect with patients for Virtual Visits (Telemedicine).  Patients are able to view lab/test results, encounter notes, upcoming appointments, etc.  Non-urgent messages can be sent to your provider as well.   To learn more about what you can do with MyChart, go to [email or web address].    Your next appointment:   2 month(s)  The format for your next appointment:   Virtual Visit   Provider:   Carlyle Dolly, MD    Other Instructions

## 2021-12-09 NOTE — Telephone Encounter (Signed)
°  Patient Consent for Virtual Visit        Lisa Griffith has provided verbal consent on 12/09/2021 for a virtual visit (video or telephone).   CONSENT FOR VIRTUAL VISIT FOR:  Lisa Griffith  By participating in this virtual visit I agree to the following:  I hereby voluntarily request, consent and authorize [place] and its employed or contracted physicians, physician assistants, nurse practitioners or other licensed health care professionals (the Practitioner), to provide me with telemedicine health care services (the Services") as deemed necessary by the treating Practitioner. I acknowledge and consent to receive the Services by the Practitioner via telemedicine. I understand that the telemedicine visit will involve communicating with the Practitioner through live audiovisual communication technology and the disclosure of certain medical information by electronic transmission. I acknowledge that I have been given the opportunity to request an in-person assessment or other available alternative prior to the telemedicine visit and am voluntarily participating in the telemedicine visit.  I understand that I have the right to withhold or withdraw my consent to the use of telemedicine in the course of my care at any time, without affecting my right to future care or treatment, and that the Practitioner or I may terminate the telemedicine visit at any time. I understand that I have the right to inspect all information obtained and/or recorded in the course of the telemedicine visit and may receive copies of available information for a reasonable fee.  I understand that some of the potential risks of receiving the Services via telemedicine include:  Delay or interruption in medical evaluation due to technological equipment failure or disruption; Information transmitted may not be sufficient (e.g. poor resolution of images) to allow for appropriate medical decision making by the  Practitioner; and/or  In rare instances, security protocols could fail, causing a breach of personal health information.  Furthermore, I acknowledge that it is my responsibility to provide information about my medical history, conditions and care that is complete and accurate to the best of my ability. I acknowledge that Practitioner's advice, recommendations, and/or decision may be based on factors not within their control, such as incomplete or inaccurate data provided by me or distortions of diagnostic images or specimens that may result from electronic transmissions. I understand that the practice of medicine is not an exact science and that Practitioner makes no warranties or guarantees regarding treatment outcomes. I acknowledge that a copy of this consent can be made available to me via my patient portal ([place]), or I can request a printed copy by calling the office of [place].    I understand that my insurance will be billed for this visit.   I have read or had this consent read to me. I understand the contents of this consent, which adequately explains the benefits and risks of the Services being provided via telemedicine.  I have been provided ample opportunity to ask questions regarding this consent and the Services and have had my questions answered to my satisfaction. I give my informed consent for the services to be provided through the use of telemedicine in my medical care

## 2021-12-09 NOTE — Progress Notes (Signed)
Clinical Summary Lisa Griffith is a 76 y.o.female seen today for follow up of the following medical problems.   1. CAD - cath 2010 with 70% ramus stenosis, otherwise patent vessels. LVEF 65% - 01/2015 Lexiscan MPI: no ischemia   -no chest pains.      2. HTN - compliant with meds. Significant dizziness on higher doses of coreg.  - she is compliant with meds   3 . HL - lipitor caused muscle aches. Tolerates pravastatin well.  - reports constipation on pravastatin (listed in postmarketing side effects)    - elevated LFTs during complex 10/2021 admission with influenza, dehdyration, lactic acidosis, hypotension. - was taking crestor 5mg  MWF, she denies side effects - 11/24/21 AST 75 ALT 69 during recent admission with influenza, hypotension.  - 05/2021 LDL 176     4. History of CVA - she reports mild CVA several years ago.    5.Influenza - admitted 10/2021 with influenza, treated with tamilflu - complicated by N/V, dehydration, hyponatremia, lactic acidosis, hypokalemia, elevated LFTs, hypotension   Past Medical History:  Diagnosis Date   Back muscle spasm    Constipation    Coronary artery disease    GERD (gastroesophageal reflux disease)    Headache(784.0)    Hypertension    Ruptured lumbar disc    Seasonal allergies    Stroke [facility]) 2010   "mild"      Allergies  Allergen Reactions   Hydrocodone Other (See Comments)    Hallucinations   Statins     Pt unsure of reaction      Current Outpatient Medications  Medication Sig Dispense Refill   aspirin 81 MG tablet Take 81 mg by mouth daily.     carvedilol (COREG) 3.125 MG tablet Take 3.125 mg by mouth 2 (two) times daily with a meal.     levothyroxine (SYNTHROID) 50 MCG tablet Take 50 mcg by mouth daily.     lisinopril-hydrochlorothiazide (ZESTORETIC) 20-25 MG tablet Take 1 tablet by mouth daily.     loratadine (CLARITIN) 10 MG tablet Take 10 mg by mouth daily as needed for allergies.     rosuvastatin  (CRESTOR) 5 MG tablet Take by mouth.     No current facility-administered medications for this visit.     Past Surgical History:  Procedure Laterality Date   CARDIAC CATHETERIZATION  12/2008   [facility]   CATARACT EXTRACTION, BILATERAL  2008   [facility]   COLONOSCOPY  01/16/2012   Procedure: COLONOSCOPY;  Surgeon: Jamesetta So, MD;  [facility];  Service: Gastroenterology;  Laterality: N/A;   COLONOSCOPY     EYE SURGERY Bilateral    cataract   LUMBAR LAMINECTOMY/DECOMPRESSION MICRODISCECTOMY Right 03/01/2015   Procedure: Right L1 Hemilaminectomy, Foraminotomy;  Surgeon: Marybelle Killings, MD;  Location: [place];  Service: Orthopedics;  Laterality: Right;   POSTERIOR LAMINECTOMY / DECOMPRESSION CERVICAL SPINE     [facility]     Allergies  Allergen Reactions   Hydrocodone Other (See Comments)    Hallucinations   Statins     Pt unsure of reaction       Family History  Problem Relation Age of Onset   Cancer Mother    Cancer Father      Social History [name] reports that she quit smoking about 24 years ago. Her smoking use included cigarettes. She started smoking about 60 years ago. She has a 17.50 pack-year smoking history. She has never used smokeless tobacco. [name] reports no history  of alcohol use.   Review of Systems CONSTITUTIONAL: No weight loss, fever, chills, weakness or fatigue.  HEENT: Eyes: No visual loss, blurred vision, double vision or yellow sclerae.No hearing loss, sneezing, congestion, runny nose or sore throat.  SKIN: No rash or itching.  CARDIOVASCULAR: per hpi RESPIRATORY: No shortness of breath, cough or sputum.  GASTROINTESTINAL: No anorexia, nausea, vomiting or diarrhea. No abdominal pain or blood.  GENITOURINARY: No burning on urination, no polyuria NEUROLOGICAL: No headache, dizziness, syncope, paralysis, ataxia, numbness or tingling in the extremities. No change in bowel or bladder control.  MUSCULOSKELETAL: No muscle, back  pain, joint pain or stiffness.  LYMPHATICS: No enlarged nodes. No history of splenectomy.  PSYCHIATRIC: No history of depression or anxiety.  ENDOCRINOLOGIC: No reports of sweating, cold or heat intolerance. No polyuria or polydipsia.  Marland Kitchen   Physical Examination Today's Vitals   12/09/21 1332  BP: 130/90  Pulse: 71  SpO2: 92%  Weight: 180 lb 9.6 oz (81.9 kg)  Height: 5\' 2"  (1.575 m)   Body mass index is 33.03 kg/m.  Gen: resting comfortably, no acute distress HEENT: no scleral icterus, pupils equal round and reactive, no palptable cervical adenopathy,  CV: RRR, no m/r/g no jvd Resp: Clear to auscultation bilaterally GI: abdomen is soft, non-tender, non-distended, normal bowel sounds, no hepatosplenomegaly MSK: extremities are warm, no edema.  Skin: warm, no rash Neuro:  no focal deficits Psych: appropriate affect   Diagnostic Studies  12/2008 Cath HEMODYNAMIC RESULTS:  1. Aortic systolic pressure 027, diastolic pressure 56.  2. Left ventricular systolic pressure 253, end-diastolic pressure 5.    SELECTIVE CHOLANGIOGRAPHY:  1. Left main normal.  2. LAD normal.  3. Left circumflex normal.  4. Ramus intermedius Jeryl Umholtz which arose as a high marginal was      moderately sized and had a 70% fairly focal proximal stenosis.  5. Right coronary was dominant normal.  6. Left ventriculography; RAO left ventriculogram was performed using      25 mL of Visipaque dye at 12 mL per second.  The overall LVEF was      estimated at greater than 60% without focal wall motion      abnormalities.    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  I do not think this is contributing to her symptoms.  Continued medical  therapy will be recommended.     06/2009 Carotid US Findings:    RIGHT CAROTID ARTERY: There is a hypoechoic structure in the right thyroid tissue which is predominately cystic and measures up to 1 cm.  There may be a small septation within this  cyst.  The right carotid arteries are patent without significant plaque formation.    RIGHT VERTEBRAL ARTERY:  Antegrade flow and normal waveform in the right vertebral artery.    LEFT CAROTID ARTERY: Mild amount plaque in the left common carotid artery.  There is plaque involving the left carotid bulb. Prominent plaque in the left carotid bulb and proximal left internal carotid artery.  This prominent plaque is likely calcified due to the posterior acoustic shadowing.  However, this does not appear to be an occlusive plaque.    LEFT VERTEBRAL ARTERY:  Antegrade flow and normal waveform in the left vertebral artery.    IMPRESSION:    Prominent calcific plaque involving the left carotid bulb and the proximal left internal carotid artery.  There is no significant internal carotid artery stenosis.  Estimated degree of stenosis in the internal  carotid arteries is less than 50%.   01/2015 Lexiscan MPI   IMPRESSION: 1. No reversible ischemia or infarction.   2. Normal left ventricular wall motion.   3. Left ventricular ejection fraction 87%   4. Low-risk stress test findings*.     Assessment and Plan   1. CAD - no synmptoms, continue current meds  2. HTN -at goal, continue current meds   3. Hyperlipidemia -difficulty tolerating statins. Has tried pravastatin, atorvastatin -08/2021 started crestor 5mg  MWF. She denies side effects. During recent admission with flu, hypotension, lactic acidosis very LFT elevation into the 70s. Statin held at that time - not clear statin was the issue with mild LFT elevation. Restart crestor 5mg  MWF, obtain recent labs from pcp and repeat lipids and cmet in 6 weeks   Virtual visit 2 months to reassess statin.      Arnoldo Lenis, M.D.

## 2021-12-21 DIAGNOSIS — E119 Type 2 diabetes mellitus without complications: Secondary | ICD-10-CM | POA: Diagnosis not present

## 2021-12-21 DIAGNOSIS — I10 Essential (primary) hypertension: Secondary | ICD-10-CM | POA: Diagnosis not present

## 2021-12-21 DIAGNOSIS — E782 Mixed hyperlipidemia: Secondary | ICD-10-CM | POA: Diagnosis not present

## 2021-12-21 DIAGNOSIS — R748 Abnormal levels of other serum enzymes: Secondary | ICD-10-CM | POA: Diagnosis not present

## 2021-12-21 DIAGNOSIS — E876 Hypokalemia: Secondary | ICD-10-CM | POA: Diagnosis not present

## 2021-12-21 DIAGNOSIS — M1990 Unspecified osteoarthritis, unspecified site: Secondary | ICD-10-CM | POA: Diagnosis not present

## 2021-12-21 DIAGNOSIS — I2510 Atherosclerotic heart disease of native coronary artery without angina pectoris: Secondary | ICD-10-CM | POA: Diagnosis not present

## 2022-01-04 DIAGNOSIS — M545 Low back pain, unspecified: Secondary | ICD-10-CM | POA: Diagnosis not present

## 2022-01-18 DIAGNOSIS — M545 Low back pain, unspecified: Secondary | ICD-10-CM | POA: Diagnosis not present

## 2022-01-20 ENCOUNTER — Other Ambulatory Visit (HOSPITAL_COMMUNITY)
Admission: RE | Admit: 2022-01-20 | Discharge: 2022-01-20 | Disposition: A | Discharge status: Home / Self Care | Payer: Medicare Other | Source: Ambulatory Visit | Attending: Cardiology | Admitting: Cardiology

## 2022-01-20 DIAGNOSIS — I10 Essential (primary) hypertension: Secondary | ICD-10-CM

## 2022-01-20 LAB — COMPREHENSIVE METABOLIC PANEL
ALT: 52 U/L — ABNORMAL HIGH (ref 0–44)
AST: 32 U/L (ref 15–41)
Albumin: 3.7 g/dL (ref 3.5–5.0)
Alkaline Phosphatase: 76 U/L (ref 38–126)
Anion gap: 11 (ref 5–15)
BUN: 15 mg/dL (ref 8–23)
CO2: 26 mmol/L (ref 22–32)
Calcium: 9.7 mg/dL (ref 8.9–10.3)
Chloride: 100 mmol/L (ref 98–111)
Creatinine, Ser: 0.69 mg/dL (ref 0.44–1.00)
GFR, Estimated: >60 mL/min (ref >60)
Glucose, Bld: 153 mg/dL — ABNORMAL HIGH (ref 70–99)
Potassium: 3.5 mmol/L (ref 3.5–5.1)
Sodium: 137 mmol/L (ref 135–145)
Total Bilirubin: 1 mg/dL (ref 0.3–1.2)
Total Protein: 7.3 g/dL (ref 6.5–8.1)

## 2022-01-20 LAB — LIPID PANEL
Cholesterol: 216 mg/dL — ABNORMAL HIGH (ref 0–200)
HDL: 71 mg/dL (ref >40)
LDL Cholesterol: 99 mg/dL (ref 0–99)
Total CHOL/HDL Ratio: 3 RATIO
Triglycerides: 229 mg/dL — ABNORMAL HIGH (ref <150)
VLDL: 46 mg/dL — ABNORMAL HIGH (ref 0–40)

## 2022-01-24 ENCOUNTER — Telehealth: Payer: Self-pay

## 2022-01-24 NOTE — Telephone Encounter (Signed)
Patient notified and verbalized understanding. Pt had no questions or concerns at this time 

## 2022-01-24 NOTE — Telephone Encounter (Signed)
-----   Message from Laurine Blazer, LPN sent at 08/17/1940  1:05 PM EST -----  ----- Message ----- From: Arnoldo Lenis, MD Sent: 01/22/2022   9:32 AM EST To: Laurine Blazer, LPN  Labs show cholesterol is improving back on statin, liver tests are normal. We have f/u in the next few weeks, may discuss trying to increase the cholesterol medications to lower her numbers even further at that time   Zandra Abts MD

## 2022-02-03 ENCOUNTER — Other Ambulatory Visit: Payer: Self-pay

## 2022-02-03 ENCOUNTER — Ambulatory Visit (INDEPENDENT_AMBULATORY_CARE_PROVIDER_SITE_OTHER): Payer: Medicare Other | Admitting: Cardiology

## 2022-02-03 ENCOUNTER — Encounter: Payer: Self-pay | Admitting: Cardiology

## 2022-02-03 VITALS — BP 143/77 | HR 71 | Ht 62.0 in | Wt 182.0 lb

## 2022-02-03 DIAGNOSIS — E782 Mixed hyperlipidemia: Secondary | ICD-10-CM

## 2022-02-03 NOTE — Progress Notes (Signed)
Virtual Visit via Telephone Note   This visit type was conducted due to national recommendations for restrictions regarding the COVID-19 Pandemic (e.g. social distancing) in an effort to limit this patient's exposure and mitigate transmission in our community.  Due to her co-morbid illnesses, this patient is at least at moderate risk for complications without adequate follow up.  This format is felt to be most appropriate for this patient at this time.  The patient did not have access to video technology/had technical difficulties with video requiring transitioning to audio format only (telephone).  All issues noted in this document were discussed and addressed.  No physical exam could be performed with this format.  Please refer to the patient's chart for her  consent to telehealth for [facility].    Date:  02/03/2022   ID:  Lisa Griffith, DOB 09-26-1946, MRN [id number] The patient was identified using 2 identifiers.  Patient Location: Home Provider Location: Office/Clinic   PCP:  Sharilyn Sites, [facility] Providers Cardiologist:  Stepfon Rawles    Evaluation Performed:  Follow-Up Visit  Chief Complaint:  Follow up for cholestero  History of Present Illness:    Lisa Griffith is a 76 y.o. female  seen today for follow up of the following medical problems. This is a focused visit on history of hyperlipidemia and recent liver enzyme elevation.     1 . Hyperlipidemia - lipitor caused muscle aches. Tolerates pravastatin well.  - reports constipation on pravastatin (listed in postmarketing side effects)     - elevated LFTs during complex 10/2021 admission with influenza, dehdyration, lactic acidosis, hypotension. - was taking crestor '5mg'$  MWF, she denies side effects - 11/24/21 AST 75 ALT 69 during recent admission with influenza, hypotension.  - 05/2021 LDL 176  -last visit restarted crestor '5mg'$  WMF - 12/2021 LFTs were normal. TC 216 TG 229 HDL 71 LDL 99 - denies  any side effects.      The patient does not have symptoms concerning for COVID-19 infection (fever, chills, cough, or new shortness of breath).    Past Medical History:  Diagnosis Date   Back muscle spasm    Constipation    Coronary artery disease    GERD (gastroesophageal reflux disease)    Headache(784.0)    Hypertension    Ruptured lumbar disc    Seasonal allergies    Stroke [facility]) 2010   "mild"    Past Surgical History:  Procedure Laterality Date   CARDIAC CATHETERIZATION  12/2008   [facility]   CATARACT EXTRACTION, BILATERAL  2008   [facility]   COLONOSCOPY  01/16/2012   Procedure: COLONOSCOPY;  Surgeon: Jamesetta So, MD;  [facility];  Service: Gastroenterology;  Laterality: N/A;   COLONOSCOPY     EYE SURGERY Bilateral    cataract   LUMBAR LAMINECTOMY/DECOMPRESSION MICRODISCECTOMY Right 03/01/2015   Procedure: Right L1 Hemilaminectomy, Foraminotomy;  Surgeon: Marybelle Killings, MD;  Location: [place];  Service: Orthopedics;  Laterality: Right;   POSTERIOR LAMINECTOMY / DECOMPRESSION CERVICAL SPINE     [facility]     No outpatient medications have been marked as taking for the 02/03/22 encounter (Appointment) with Arnoldo Lenis, MD.     Allergies:   Hydrocodone and Statins   Social History   Tobacco Use   Smoking status: Former    Packs/day: 0.50    Years: 35.00    Pack years: 17.50    Types: Cigarettes    Start date: 11/08/1961  Quit date: 01/26/1997    Years since quitting: 25.0   Smokeless tobacco: Never  Vaping Use   Vaping Use: Never used  Substance Use Topics   Alcohol use: No    Alcohol/week: 0.0 standard drinks    Comment: occasional wine/beer   Drug use: No     Family Hx: The patient's family history includes Cancer in her father and mother.  ROS:   Please see the history of present illness.     All other systems reviewed and are negative.   Prior CV studies:   The following studies were reviewed today:  12/2008 Cath HEMODYNAMIC  RESULTS:  1. Aortic systolic pressure 607, diastolic pressure 56.  2. Left ventricular systolic pressure 371, end-diastolic pressure 5.    SELECTIVE CHOLANGIOGRAPHY:  1. Left main normal.  2. LAD normal.  3. Left circumflex normal.  4. Ramus intermedius Tyheem Boughner which arose as a high marginal was      moderately sized and had a 70% fairly focal proximal stenosis.  5. Right coronary was dominant normal.  6. Left ventriculography; RAO left ventriculogram was performed using      25 mL of Visipaque dye at 12 mL per second.  The overall LVEF was      estimated at greater than 60% without focal wall motion      abnormalities.    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  I do not think this is contributing to her symptoms.  Continued medical  therapy will be recommended.     06/2009 Carotid US Findings:    RIGHT CAROTID ARTERY: There is a hypoechoic structure in the right thyroid tissue which is predominately cystic and measures up to 1 cm.  There may be a small septation within this cyst.  The right carotid arteries are patent without significant plaque formation.    RIGHT VERTEBRAL ARTERY:  Antegrade flow and normal waveform in the right vertebral artery.    LEFT CAROTID ARTERY: Mild amount plaque in the left common carotid artery.  There is plaque involving the left carotid bulb. Prominent plaque in the left carotid bulb and proximal left internal carotid artery.  This prominent plaque is likely calcified due to the posterior acoustic shadowing.  However, this does not appear to be an occlusive plaque.    LEFT VERTEBRAL ARTERY:  Antegrade flow and normal waveform in the left vertebral artery.    IMPRESSION:    Prominent calcific plaque involving the left carotid bulb and the proximal left internal carotid artery.  There is no significant internal carotid artery stenosis.  Estimated degree of stenosis in the internal carotid arteries is less than  50%.   01/2015 Lexiscan MPI   IMPRESSION: 1. No reversible ischemia or infarction.   2. Normal left ventricular wall motion.   3. Left ventricular ejection fraction 87%   4. Low-risk stress test findings*.  Labs/Other Tests and Data Reviewed:    EKG:  No ECG reviewed.  Recent Labs: 11/26/2021: Hemoglobin 12.5; Magnesium 1.9; Platelets 143 01/20/2022: ALT 52; BUN 15; Creatinine, Ser 0.69; Potassium 3.5; Sodium 137   Recent Lipid Panel Lab Results  Component Value Date/Time   CHOL 216 (H) 01/20/2022 07:53 AM   TRIG 229 (H) 01/20/2022 07:53 AM   HDL 71 01/20/2022 07:53 AM   CHOLHDL 3.0 01/20/2022 07:53 AM   LDLCALC 99 01/20/2022 07:53 AM    Wt Readings from Last 3 Encounters:  12/09/21 180 lb 9.6 oz (81.9 kg)  12/08/21 181 lb (82.1 kg)  11/25/21 181 lb 9.6 oz (82.4 kg)     Risk Assessment/Calculations:          Objective:    Vital Signs:  Today's Vitals   02/03/22 0817  BP: (!) 143/77  Pulse: 71  Weight: 182 lb (82.6 kg)  Height: '5\' 2"'$  (1.575 m)   Body mass index is 33.29 kg/m. Normal affect. Normal speech pattern and tone. Comfortable, no apparent distress. No audible signs of sob or wheezing.   ASSESSMENT & PLAN:        1. Hyperlipidemia -difficulty tolerating statins. Has tried pravastatin, atorvastatin -08/2021 started crestor '5mg'$  MWF. She denies side effects. During recent admission with flu, hypotension, lactic acidosis very LFT elevation into the 70s. Statin held at that time -restarted statin '5mg'$  MWF, tolerating well. Normal LFTs, LDL down to 99. Goal would be at least <70 - increase crestor to '10mg'$  MWF, recheck hepatic panel and lipids 2 months              COVID-19 Education: The signs and symptoms of COVID-19 were discussed with the patient and how to seek care for testing (follow up with PCP or arrange E-visit).  The importance of social distancing was discussed today.  Time:   Today, I have spent 15 minutes with the patient with  telehealth technology discussing the above problems.     Medication Adjustments/Labs and Tests Ordered: Current medicines are reviewed at length with the patient today.  Concerns regarding medicines are outlined above.   Tests Ordered: No orders of the defined types were placed in this encounter.   Medication Changes: No orders of the defined types were placed in this encounter.   Follow Up:  In Person 6 months  Signed, Carlyle Dolly, MD  02/03/2022 7:52 AM    [place]

## 2022-02-03 NOTE — Patient Instructions (Signed)
Labwork: ?In 2 months: ?-Hepatic Panel ?-Lipid Panel ? ?Follow-Up: ?Follow up with Dr. Harl Bowie in 6 months.  ? ?Any Other Special Instructions Will Be Listed Below (If Applicable). ? ? ? ? ?If you need a refill on your cardiac medications before your next appointment, please call your pharmacy. ? ?

## 2022-02-03 NOTE — Addendum Note (Signed)
Addended by: Christella Scheuermann C on: 02/03/2022 09:00 AM ? ? Modules accepted: Orders ? ?

## 2022-03-06 ENCOUNTER — Other Ambulatory Visit: Payer: Self-pay | Admitting: Cardiology

## 2022-03-31 DIAGNOSIS — M545 Low back pain, unspecified: Secondary | ICD-10-CM | POA: Diagnosis not present

## 2022-07-01 IMAGING — MG DIGITAL SCREENING BILAT W/ TOMO W/ CAD
8 series · 8 of 24 positions shown · non-contrast
Comparison: Previous exam(s).

CLINICAL DATA: Screening.

EXAM:
DIGITAL SCREENING BILATERAL MAMMOGRAM WITH TOMO AND CAD

[L CC synth-2D]
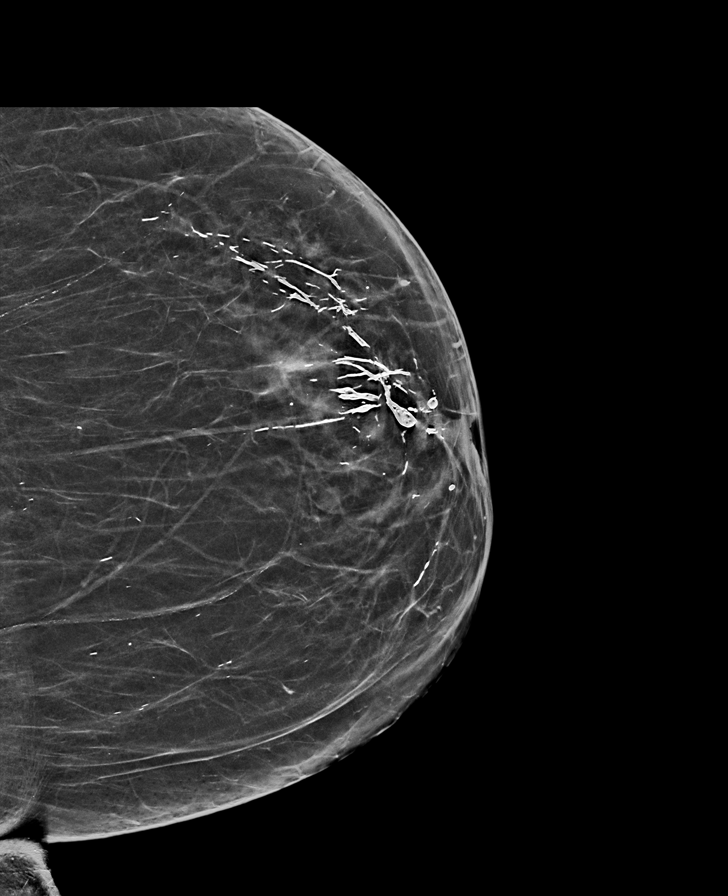

[R CC synth-2D]
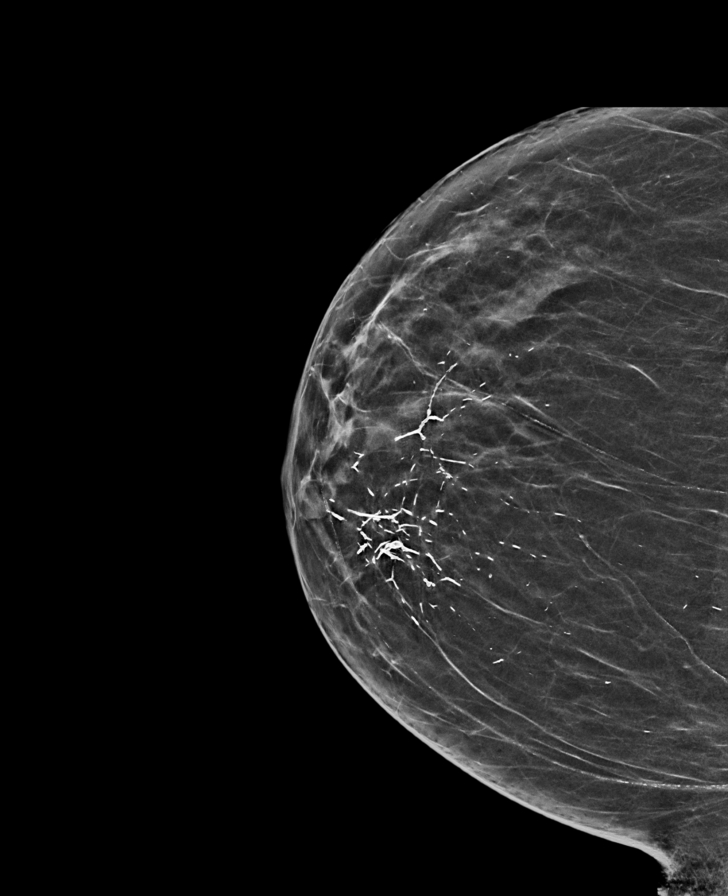

[L MLO synth-2D]
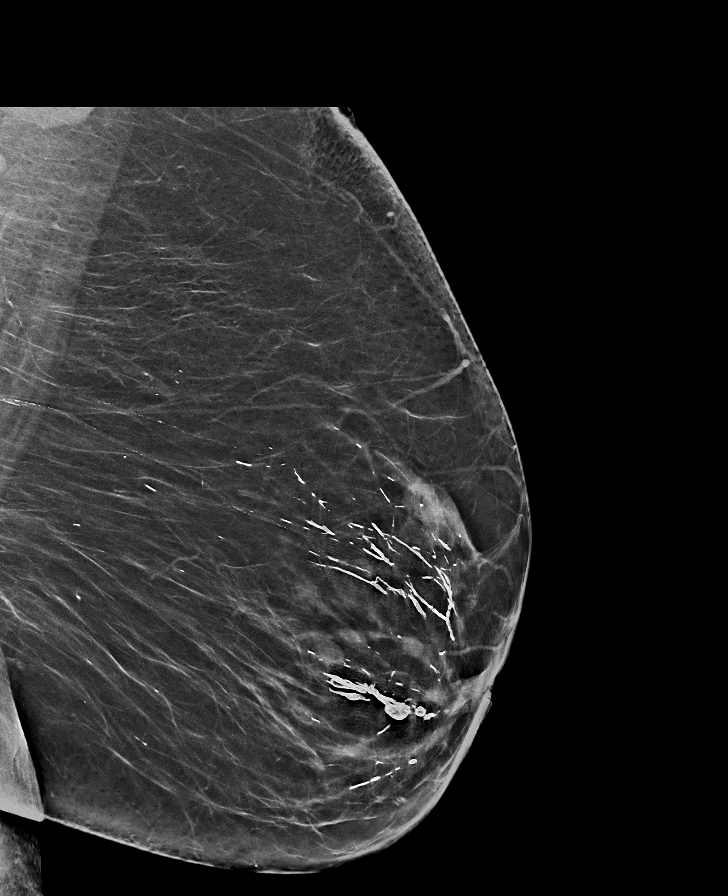

[R MLO synth-2D]
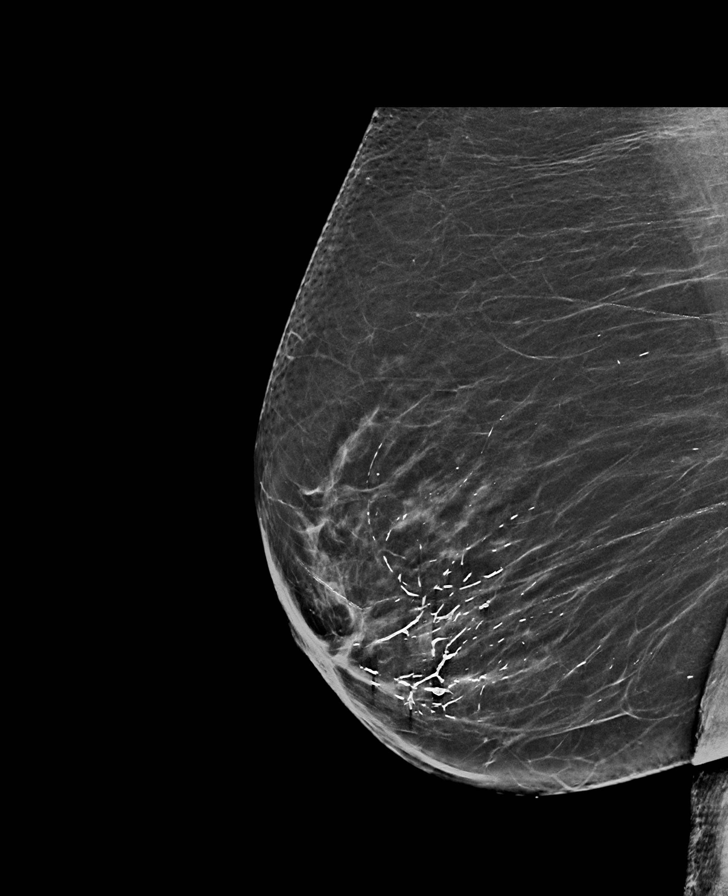

[L CC tomo · tomo slice 31/60.0]
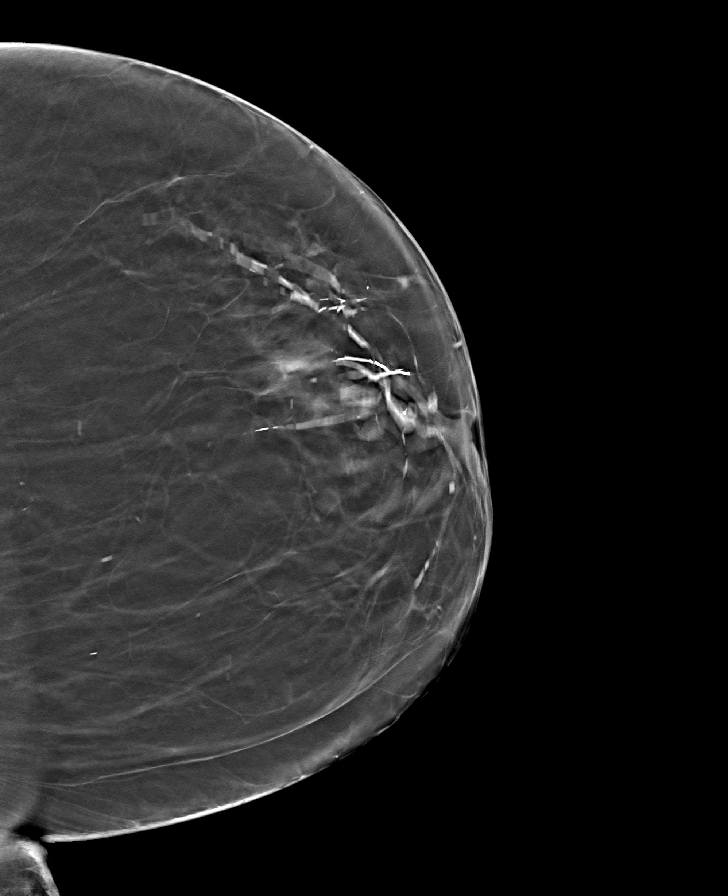

[L MLO tomo · tomo slice 36/71.0]
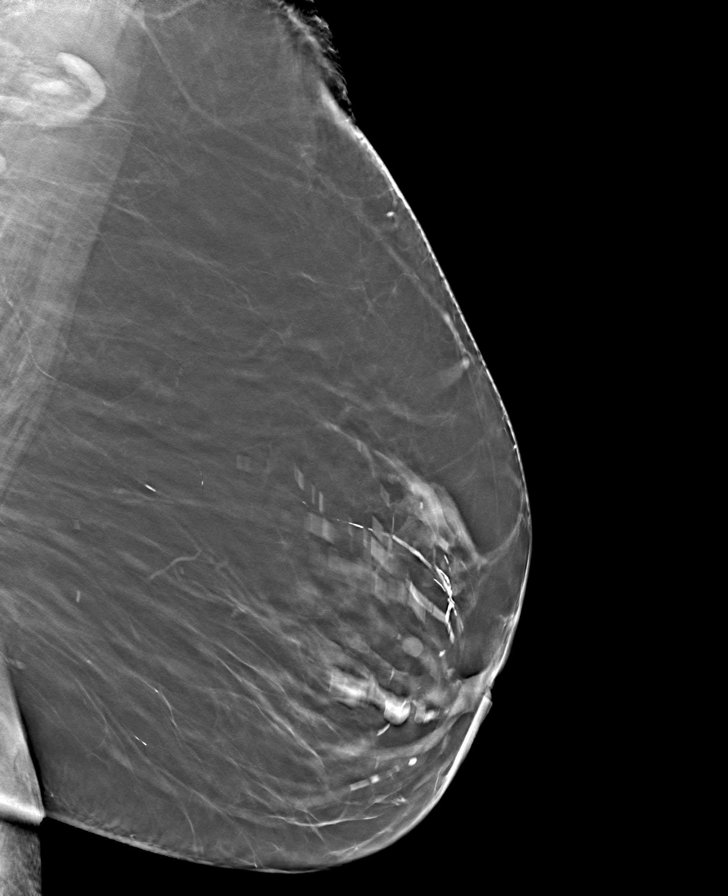

[R CC tomo · tomo slice 29/58.0]
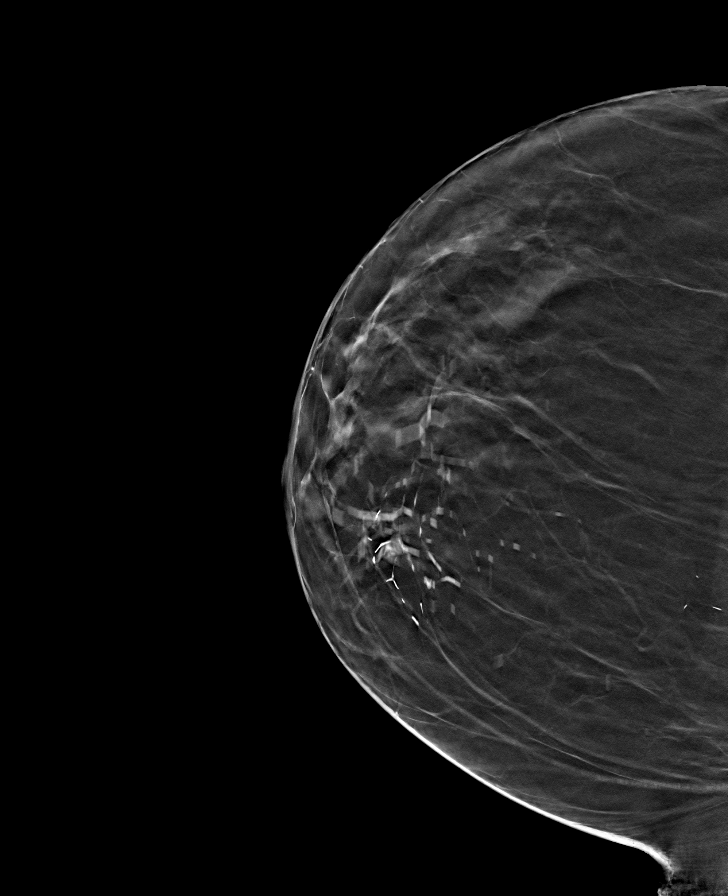

[R MLO tomo · tomo slice 33/66.0]
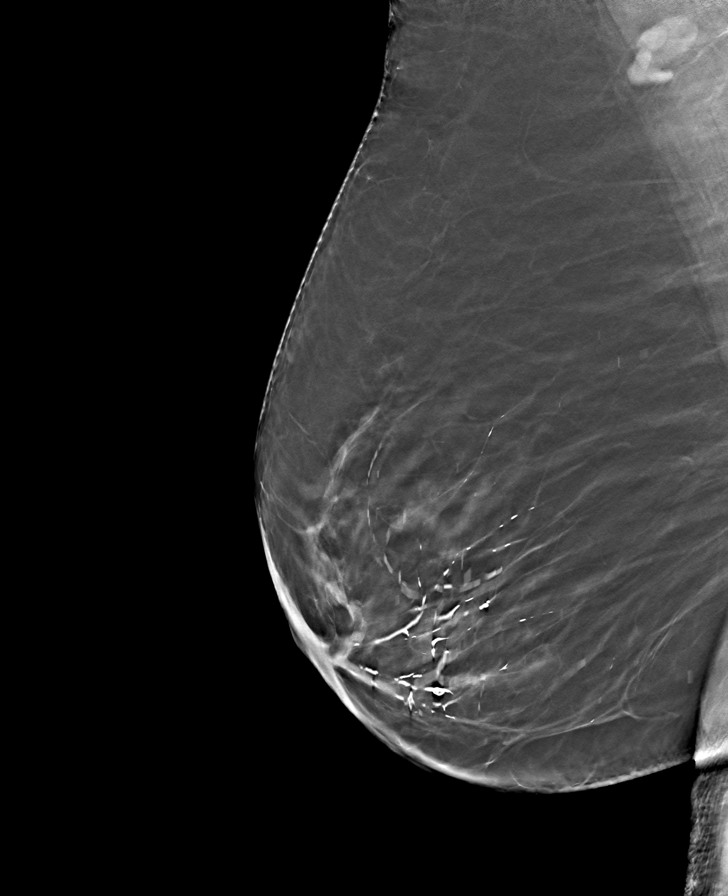

[8 of 24 positions shown; findings below may reference images not displayed]

ACR Breast Density Category b: There are scattered areas of
fibroglandular density.
FINDINGS: There are no findings suspicious for malignancy. Images were
processed with CAD.
IMPRESSION: No mammographic evidence of malignancy. A result letter of this
screening mammogram will be mailed directly to the patient.

RECOMMENDATION:
Screening mammogram in one year. (Code:[id number])

BI-RADS CATEGORY  1: Negative.

## 2022-07-11 ENCOUNTER — Other Ambulatory Visit (HOSPITAL_COMMUNITY): Payer: Self-pay | Admitting: Family Medicine

## 2022-07-11 DIAGNOSIS — Z1231 Encounter for screening mammogram for malignant neoplasm of breast: Secondary | ICD-10-CM

## 2022-07-26 DIAGNOSIS — M1991 Primary osteoarthritis, unspecified site: Secondary | ICD-10-CM | POA: Diagnosis not present

## 2022-07-26 DIAGNOSIS — M5431 Sciatica, right side: Secondary | ICD-10-CM | POA: Diagnosis not present

## 2022-08-16 ENCOUNTER — Other Ambulatory Visit (HOSPITAL_COMMUNITY)
Admission: RE | Admit: 2022-08-16 | Discharge: 2022-08-16 | Disposition: A | Discharge status: Home / Self Care | Payer: Medicare Other | Source: Ambulatory Visit | Attending: Cardiology | Admitting: Cardiology

## 2022-08-16 ENCOUNTER — Other Ambulatory Visit: Payer: Medicare Other | Attending: Cardiology | Admitting: Cardiology

## 2022-08-16 ENCOUNTER — Encounter: Payer: Self-pay | Admitting: Cardiology

## 2022-08-16 VITALS — BP 135/85 | HR 70 | Ht 62.0 in | Wt 184.6 lb

## 2022-08-16 DIAGNOSIS — E782 Mixed hyperlipidemia: Secondary | ICD-10-CM

## 2022-08-16 DIAGNOSIS — I2510 Atherosclerotic heart disease of native coronary artery without angina pectoris: Secondary | ICD-10-CM | POA: Diagnosis not present

## 2022-08-16 DIAGNOSIS — I10 Essential (primary) hypertension: Secondary | ICD-10-CM

## 2022-08-16 LAB — HEPATIC FUNCTION PANEL
ALT: 72 U/L — ABNORMAL HIGH (ref 0–44)
AST: 40 U/L (ref 15–41)
Albumin: 3.8 g/dL (ref 3.5–5.0)
Alkaline Phosphatase: 91 U/L (ref 38–126)
Bilirubin, Direct: 0.1 mg/dL (ref 0.0–0.2)
Indirect Bilirubin: 0.7 mg/dL (ref 0.3–0.9)
Total Bilirubin: 0.8 mg/dL (ref 0.3–1.2)
Total Protein: 7.3 g/dL (ref 6.5–8.1)

## 2022-08-16 LAB — LIPID PANEL
Cholesterol: 286 mg/dL — ABNORMAL HIGH (ref 0–200)
HDL: 84 mg/dL (ref >40)
LDL Cholesterol: 168 mg/dL — ABNORMAL HIGH (ref 0–99)
Total CHOL/HDL Ratio: 3.4 RATIO
Triglycerides: 171 mg/dL — ABNORMAL HIGH (ref <150)
VLDL: 34 mg/dL (ref 0–40)

## 2022-08-16 MED ORDER — ROSUVASTATIN CALCIUM 10 MG PO TABS: 10.0000 mg | ORAL_TABLET | ORAL | 3 refills | Status: DC

## 2022-08-16 NOTE — Patient Instructions (Signed)
Medication Instructions:  Your physician has recommended you make the following change in your medication:  Increase Crestor to 10 mg tablets daily   Labwork: In 2 months: -Fasting Lipid Panel (nothing to eat/drink 8 hours prior to having lab work) -CMET  Testing/Procedures: None  Follow-Up: Follow up with Dr. Harl Bowie in 6 months.  Any Other Special Instructions Will Be Listed Below (If Applicable).     If you need a refill on your cardiac medications before your next appointment, please call your pharmacy.

## 2022-08-16 NOTE — Progress Notes (Signed)
Clinical Summary Ms. Karch is a 76 y.o.female seen today for follow up of the following medical problems.     1 . Hyperlipidemia - lipitor caused muscle aches. Tolerates pravastatin well.  - reports constipation on pravastatin (listed in postmarketing side effects)     - elevated LFTs during complex 10/2021 admission with influenza, dehdyration, lactic acidosis, hypotension. - was taking crestor '5mg'$  MWF, she denies side effects - 11/24/21 AST 75 ALT 69 during recent admission with influenza, hypotension.  - 05/2021 LDL 176   -last visit restarted crestor '5mg'$  WMF - 12/2021 LFTs were normal. TC 216 TG 229 HDL 71 LDL 99 - denies any side effects.   - last visit we increased crestor to '10mg'$  MWF, tolerating without side effects - did run out of statin 1 month ago - LFTs ALT 72, chronically mildly elevated.  TC 286 TG 171 HDL 84 LDL 168    2. CAD - cath 2010 with 70% ramus stenosis, otherwise patent vessels. LVEF 65% - 01/2015 Lexiscan MPI: no ischemia   - no exertoinal pains.      3. HTN -  Significant dizziness on higher doses of coreg.  - compliant with meds - currently on prednisone course   5. History of CVA - she reports mild CVA several years ago.    Past Medical History:  Diagnosis Date   Back muscle spasm    Constipation    Coronary artery disease    GERD (gastroesophageal reflux disease)    Headache(784.0)    Hypertension    Ruptured lumbar disc    Seasonal allergies    Stroke [facility]) 2010   "mild"      Allergies  Allergen Reactions   Hydrocodone Other (See Comments)    Hallucinations   Statins     Pt unsure of reaction      Current Outpatient Medications  Medication Sig Dispense Refill   aspirin 81 MG tablet Take 81 mg by mouth daily.     carvedilol (COREG) 3.125 MG tablet Take 3.125 mg by mouth 2 (two) times daily with a meal.     levothyroxine (SYNTHROID) 50 MCG tablet Take 50 mcg by mouth daily.     lisinopril-hydrochlorothiazide  (ZESTORETIC) 20-25 MG tablet Take 1 tablet by mouth daily.     loratadine (CLARITIN) 10 MG tablet Take 10 mg by mouth daily as needed for allergies.     rosuvastatin (CRESTOR) 5 MG tablet TAKE 1 TABLET (5 MG TOTAL) BY MOUTH EVERY MONDAY, WEDNESDAY, AND FRIDAY. 45 tablet 2   No current facility-administered medications for this visit.     Past Surgical History:  Procedure Laterality Date   CARDIAC CATHETERIZATION  12/2008   [facility]   CATARACT EXTRACTION, BILATERAL  2008   [facility]   COLONOSCOPY  01/16/2012   Procedure: COLONOSCOPY;  Surgeon: Jamesetta So, MD;  [facility];  Service: Gastroenterology;  Laterality: N/A;   COLONOSCOPY     EYE SURGERY Bilateral    cataract   LUMBAR LAMINECTOMY/DECOMPRESSION MICRODISCECTOMY Right 03/01/2015   Procedure: Right L1 Hemilaminectomy, Foraminotomy;  Surgeon: Marybelle Killings, MD;  Location: [place];  Service: Orthopedics;  Laterality: Right;   POSTERIOR LAMINECTOMY / DECOMPRESSION CERVICAL SPINE     [facility]     Allergies  Allergen Reactions   Hydrocodone Other (See Comments)    Hallucinations   Statins     Pt unsure of reaction       Family History  Problem Relation Age  of Onset   Cancer Mother    Cancer Father      Social History Ms. Poucher reports that she quit smoking about 25 years ago. Her smoking use included cigarettes. She started smoking about 60 years ago. She has a 17.50 pack-year smoking history. She has never used smokeless tobacco. Ms. Lorson reports no history of alcohol use.   Review of Systems CONSTITUTIONAL: No weight loss, fever, chills, weakness or fatigue.  HEENT: Eyes: No visual loss, blurred vision, double vision or yellow sclerae.No hearing loss, sneezing, congestion, runny nose or sore throat.  SKIN: No rash or itching.  CARDIOVASCULAR: per hpi RESPIRATORY: No shortness of breath, cough or sputum.  GASTROINTESTINAL: No anorexia, nausea, vomiting or diarrhea. No abdominal pain or blood.   GENITOURINARY: No burning on urination, no polyuria NEUROLOGICAL: No headache, dizziness, syncope, paralysis, ataxia, numbness or tingling in the extremities. No change in bowel or bladder control.  MUSCULOSKELETAL: No muscle, back pain, joint pain or stiffness.  LYMPHATICS: No enlarged nodes. No history of splenectomy.  PSYCHIATRIC: No history of depression or anxiety.  ENDOCRINOLOGIC: No reports of sweating, cold or heat intolerance. No polyuria or polydipsia.  Marland Kitchen   Physical Examination Today's Vitals   08/16/22 0855  BP: (!) 146/84  Pulse: 70  SpO2: 97%  Weight: 184 lb 9.6 oz (83.7 kg)  Height: '5\' 2"'$  (1.575 m)   Body mass index is 33.76 kg/m.  Gen: resting comfortably, no acute distress HEENT: no scleral icterus, pupils equal round and reactive, no palptable cervical adenopathy,  CV: RRR, no m/r/g no jvd Resp: Clear to auscultation bilaterally GI: abdomen is soft, non-tender, non-distended, normal bowel sounds, no hepatosplenomegaly MSK: extremities are warm, no edema.  Skin: warm, no rash Neuro:  no focal deficits Psych: appropriate affect   Diagnostic Studies  12/2008 Cath HEMODYNAMIC RESULTS:  1. Aortic systolic pressure 170, diastolic pressure 56.  2. Left ventricular systolic pressure 017, end-diastolic pressure 5.    SELECTIVE CHOLANGIOGRAPHY:  1. Left main normal.  2. LAD normal.  3. Left circumflex normal.  4. Ramus intermedius Ursula Dermody which arose as a high marginal was      moderately sized and had a 70% fairly focal proximal stenosis.  5. Right coronary was dominant normal.  6. Left ventriculography; RAO left ventriculogram was performed using      25 mL of Visipaque dye at 12 mL per second.  The overall LVEF was      estimated at greater than 60% without focal wall motion      abnormalities.    IMPRESSION:  Ms. 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  I do not think this is contributing to her symptoms.  Continued medical  therapy will  be recommended.     06/2009 Carotid US Findings:    RIGHT CAROTID ARTERY: There is a hypoechoic structure in the right thyroid tissue which is predominately cystic and measures up to 1 cm.  There may be a small septation within this cyst.  The right carotid arteries are patent without significant plaque formation.    RIGHT VERTEBRAL ARTERY:  Antegrade flow and normal waveform in the right vertebral artery.    LEFT CAROTID ARTERY: Mild amount plaque in the left common carotid artery.  There is plaque involving the left carotid bulb. Prominent plaque in the left carotid bulb and proximal left internal carotid artery.  This prominent plaque is likely calcified due to the posterior acoustic shadowing.  However, this does not appear to  be an occlusive plaque.    LEFT VERTEBRAL ARTERY:  Antegrade flow and normal waveform in the left vertebral artery.    IMPRESSION:    Prominent calcific plaque involving the left carotid bulb and the proximal left internal carotid artery.  There is no significant internal carotid artery stenosis.  Estimated degree of stenosis in the internal carotid arteries is less than 50%.   01/2015 Lexiscan MPI   IMPRESSION: 1. No reversible ischemia or infarction.   2. Normal left ventricular wall motion.   3. Left ventricular ejection fraction 87%   4. Low-risk stress test findings*.   Assessment and Plan  1. Hyperlipidemia -difficulty tolerating statins. Has tried pravastatin, atorvastatin -08/2021 started crestor '5mg'$  MWF. She denies side effects. During recent admission with flu, hypotension, lactic acidosis very mild LFT elevation into the 70s. Statin held at that time -restarted statin '5mg'$  MWF, tolerating well. Normal LFTs, LDL down to 99. Goal would be at least <70 - increased crestor to '10mg'$  MWF but she ran out last months explaining why LDL has increased on todays check. Restart crestor and recheck labs 2 months  2. CAD - no recent symptoms,  continue current meds   3. HTN -elevated but currently on prednisone, continue to monitor   Arnoldo Lenis, M.D.

## 2022-08-21 ENCOUNTER — Ambulatory Visit (HOSPITAL_COMMUNITY)
Admission: RE | Admit: 2022-08-21 | Discharge: 2022-08-21 | Disposition: A | Discharge status: Home / Self Care | Payer: Medicare Other | Source: Ambulatory Visit | Attending: Family Medicine | Admitting: Family Medicine

## 2022-08-21 DIAGNOSIS — Z1231 Encounter for screening mammogram for malignant neoplasm of breast: Secondary | ICD-10-CM | POA: Diagnosis not present

## 2022-08-29 DIAGNOSIS — M25561 Pain in right knee: Secondary | ICD-10-CM | POA: Diagnosis not present

## 2022-09-04 DIAGNOSIS — M25561 Pain in right knee: Secondary | ICD-10-CM | POA: Diagnosis not present

## 2022-09-07 DIAGNOSIS — M25561 Pain in right knee: Secondary | ICD-10-CM | POA: Diagnosis not present

## 2022-09-11 DIAGNOSIS — M25561 Pain in right knee: Secondary | ICD-10-CM | POA: Diagnosis not present

## 2022-09-14 DIAGNOSIS — M25561 Pain in right knee: Secondary | ICD-10-CM | POA: Diagnosis not present

## 2022-09-21 DIAGNOSIS — M25561 Pain in right knee: Secondary | ICD-10-CM | POA: Diagnosis not present

## 2022-09-27 DIAGNOSIS — M25561 Pain in right knee: Secondary | ICD-10-CM | POA: Diagnosis not present

## 2022-09-29 DIAGNOSIS — M25561 Pain in right knee: Secondary | ICD-10-CM | POA: Diagnosis not present

## 2022-09-29 DIAGNOSIS — M170 Bilateral primary osteoarthritis of knee: Secondary | ICD-10-CM | POA: Diagnosis not present

## 2022-10-09 DIAGNOSIS — E538 Deficiency of other specified B group vitamins: Secondary | ICD-10-CM | POA: Diagnosis not present

## 2022-10-09 DIAGNOSIS — E559 Vitamin D deficiency, unspecified: Secondary | ICD-10-CM | POA: Diagnosis not present

## 2022-10-09 DIAGNOSIS — E119 Type 2 diabetes mellitus without complications: Secondary | ICD-10-CM | POA: Diagnosis not present

## 2022-10-09 DIAGNOSIS — R748 Abnormal levels of other serum enzymes: Secondary | ICD-10-CM | POA: Diagnosis not present

## 2022-10-09 DIAGNOSIS — R5383 Other fatigue: Secondary | ICD-10-CM | POA: Diagnosis not present

## 2022-10-09 DIAGNOSIS — Z789 Other specified health status: Secondary | ICD-10-CM | POA: Diagnosis not present

## 2022-11-08 DIAGNOSIS — Z0001 Encounter for general adult medical examination with abnormal findings: Secondary | ICD-10-CM | POA: Diagnosis not present

## 2022-11-08 DIAGNOSIS — E876 Hypokalemia: Secondary | ICD-10-CM | POA: Diagnosis not present

## 2022-11-08 DIAGNOSIS — I10 Essential (primary) hypertension: Secondary | ICD-10-CM | POA: Diagnosis not present

## 2022-11-08 DIAGNOSIS — E7849 Other hyperlipidemia: Secondary | ICD-10-CM | POA: Diagnosis not present

## 2022-11-08 DIAGNOSIS — I2510 Atherosclerotic heart disease of native coronary artery without angina pectoris: Secondary | ICD-10-CM | POA: Diagnosis not present

## 2022-11-08 DIAGNOSIS — E782 Mixed hyperlipidemia: Secondary | ICD-10-CM | POA: Diagnosis not present

## 2022-11-08 DIAGNOSIS — R5383 Other fatigue: Secondary | ICD-10-CM | POA: Diagnosis not present

## 2022-11-08 DIAGNOSIS — Z789 Other specified health status: Secondary | ICD-10-CM | POA: Diagnosis not present

## 2022-11-08 DIAGNOSIS — E538 Deficiency of other specified B group vitamins: Secondary | ICD-10-CM | POA: Diagnosis not present

## 2022-11-08 DIAGNOSIS — E559 Vitamin D deficiency, unspecified: Secondary | ICD-10-CM | POA: Diagnosis not present

## 2022-11-08 DIAGNOSIS — E119 Type 2 diabetes mellitus without complications: Secondary | ICD-10-CM | POA: Diagnosis not present

## 2022-11-08 DIAGNOSIS — R748 Abnormal levels of other serum enzymes: Secondary | ICD-10-CM | POA: Diagnosis not present

## 2022-12-07 DIAGNOSIS — Z23 Encounter for immunization: Secondary | ICD-10-CM | POA: Diagnosis not present

## 2023-02-02 ENCOUNTER — Ambulatory Visit: Payer: Medicare Other | Admitting: Cardiology

## 2023-02-08 ENCOUNTER — Other Ambulatory Visit (HOSPITAL_COMMUNITY)
Admission: RE | Admit: 2023-02-08 | Discharge: 2023-02-08 | Disposition: A | Discharge status: Home / Self Care | Payer: Medicare Other | Source: Ambulatory Visit | Attending: Cardiology | Admitting: Cardiology

## 2023-02-08 DIAGNOSIS — E782 Mixed hyperlipidemia: Secondary | ICD-10-CM

## 2023-02-08 LAB — LIPID PANEL
Cholesterol: 253 mg/dL — ABNORMAL HIGH (ref 0–200)
HDL: 73 mg/dL (ref >40)
LDL Cholesterol: 136 mg/dL — ABNORMAL HIGH (ref 0–99)
Total CHOL/HDL Ratio: 3.5 RATIO
Triglycerides: 218 mg/dL — ABNORMAL HIGH (ref <150)
VLDL: 44 mg/dL — ABNORMAL HIGH (ref 0–40)

## 2023-02-08 LAB — COMPREHENSIVE METABOLIC PANEL
ALT: 100 U/L — ABNORMAL HIGH (ref 0–44)
AST: 69 U/L — ABNORMAL HIGH (ref 15–41)
Albumin: 3.9 g/dL (ref 3.5–5.0)
Alkaline Phosphatase: 86 U/L (ref 38–126)
Anion gap: 11 (ref 5–15)
BUN: 11 mg/dL (ref 8–23)
CO2: 28 mmol/L (ref 22–32)
Calcium: 9.8 mg/dL (ref 8.9–10.3)
Chloride: 97 mmol/L — ABNORMAL LOW (ref 98–111)
Creatinine, Ser: 0.75 mg/dL (ref 0.44–1.00)
GFR, Estimated: >60 mL/min (ref >60)
Glucose, Bld: 143 mg/dL — ABNORMAL HIGH (ref 70–99)
Potassium: 4.5 mmol/L (ref 3.5–5.1)
Sodium: 136 mmol/L (ref 135–145)
Total Bilirubin: 0.8 mg/dL (ref 0.3–1.2)
Total Protein: 7.3 g/dL (ref 6.5–8.1)

## 2023-02-09 ENCOUNTER — Encounter: Payer: Self-pay | Admitting: Nurse Practitioner

## 2023-02-09 ENCOUNTER — Ambulatory Visit: Payer: Medicare Other | Attending: Cardiology | Admitting: Nurse Practitioner

## 2023-02-09 VITALS — BP 132/70 | HR 72 | Ht 62.0 in | Wt 182.0 lb

## 2023-02-09 DIAGNOSIS — Z8673 Personal history of transient ischemic attack (TIA), and cerebral infarction without residual deficits: Secondary | ICD-10-CM

## 2023-02-09 DIAGNOSIS — E785 Hyperlipidemia, unspecified: Secondary | ICD-10-CM | POA: Diagnosis not present

## 2023-02-09 DIAGNOSIS — I10 Essential (primary) hypertension: Secondary | ICD-10-CM | POA: Diagnosis not present

## 2023-02-09 DIAGNOSIS — E669 Obesity, unspecified: Secondary | ICD-10-CM

## 2023-02-09 DIAGNOSIS — I2510 Atherosclerotic heart disease of native coronary artery without angina pectoris: Secondary | ICD-10-CM

## 2023-02-09 NOTE — Progress Notes (Signed)
Office Visit    Patient Name: Lisa Griffith Date of Encounter: 02/09/2023  PCP:  Sharilyn Sites, [place]  Cardiologist:  Carlyle Dolly, MD  Advanced Practice Provider:  Finis Bud, NP Electrophysiologist:  None   Chief Complaint    Lisa Griffith is a 77 y.o. female with a hx of CAD, hypertension, hyperlipidemia, GERD, and hx of CVA, who presents today for 22-month follow-up.  Past Medical History    Past Medical History:  Diagnosis Date   Back muscle spasm    Constipation    Coronary artery disease    GERD (gastroesophageal reflux disease)    Headache(784.0)    Hypertension    Ruptured lumbar disc    Seasonal allergies    Stroke [facility]) 2010   "mild"    Past Surgical History:  Procedure Laterality Date   CARDIAC CATHETERIZATION  12/2008   [facility]   CATARACT EXTRACTION, BILATERAL  2008   [facility]   COLONOSCOPY  01/16/2012   Procedure: COLONOSCOPY;  Surgeon: Jamesetta So, MD;  [facility];  Service: Gastroenterology;  Laterality: N/A;   COLONOSCOPY     EYE SURGERY Bilateral    cataract   LUMBAR LAMINECTOMY/DECOMPRESSION MICRODISCECTOMY Right 03/01/2015   Procedure: Right L1 Hemilaminectomy, Foraminotomy;  Surgeon: Marybelle Killings, MD;  Location: [place];  Service: Orthopedics;  Laterality: Right;   POSTERIOR LAMINECTOMY / DECOMPRESSION CERVICAL SPINE     [facility]    Allergies  Allergies  Allergen Reactions   Hydrocodone Other (See Comments)    Hallucinations   Statins     Pt unsure of reaction     History of Present Illness    Lisa Griffith is a 77 y.o. female with a PMH as mentioned above.  Last seen by Dr. Carlyle Dolly on August 16, 2022.  Was unable to tolerate statins, was taking Crestor 10 mg on Mondays, Wednesdays, and Fridays.  She ran out of medicine, and was restarted to lower LDL.  Overall was doing well from a cardiac perspective.  Blood pressure was a little elevated while on  prednisone, recommended to monitor.  Today she presents for follow-up.  She states she is doing well. Denies any chest pain, shortness of breath, palpitations, syncope, presyncope, dizziness, orthopnea, PND, swelling or significant weight changes, acute bleeding, or claudication. Watching her diet and plans on increasing her physical activity.   SH: Daughter was a Audiological scientist Studies Reviewed:   The following studies were reviewed today:  EKG:  EKG is ordered today.  The ekg ordered today demonstrates SR, 72 bpm, first degree AV block, no acute ischemic changes.    Lexiscan 01/2015: IMPRESSION: 1. No reversible ischemia or infarction.   2. Normal left ventricular wall motion.   3. Left ventricular ejection fraction 87%   4. Low-risk stress test findings*.  Recent Labs: 02/08/2023: ALT 100; BUN 11; Creatinine, Ser 0.75; Potassium 4.5; Sodium 136  Recent Lipid Panel    Component Value Date/Time   CHOL 253 (H) 02/08/2023 0825   TRIG 218 (H) 02/08/2023 0825   HDL 73 02/08/2023 0825   CHOLHDL 3.5 02/08/2023 0825   VLDL 44 (H) 02/08/2023 0825   LDLCALC 136 (H) 02/08/2023 0825   Home Medications   Current Meds  Medication Sig   aspirin 81 MG tablet Take 81 mg by mouth daily.   carvedilol (COREG) 3.125 MG tablet Take 3.125 mg by mouth 2 (two) times daily with a meal.  metFORMIN (GLUCOPHAGE) 1000 MG tablet Take 1,000 mg by mouth daily with breakfast.   olmesartan-hydrochlorothiazide (BENICAR HCT) 40-25 MG tablet Take 1 tablet by mouth daily.     Review of Systems    All other systems reviewed and are otherwise negative except as noted above.  Physical Exam    VS:  BP 132/70   Pulse 72   Ht 5\' 2"  (1.575 m)   Wt 182 lb (82.6 kg)   SpO2 96%   BMI 33.29 kg/m  , BMI Body mass index is 33.29 kg/m.  Wt Readings from Last 3 Encounters:  02/09/23 182 lb (82.6 kg)  08/16/22 184 lb 9.6 oz (83.7 kg)  02/03/22 182 lb (82.6 kg)     GEN: Obese, 77 y.o. female in no  acute distress. HEENT: normal. Neck: Supple, no JVD, carotid bruits, or masses. Cardiac: S1/S2, RRR, no murmurs, rubs, or gallops. No clubbing, cyanosis, edema.  Radials/PT 2+ and equal bilaterally.  Respiratory:  Respirations regular and unlabored, clear to auscultation bilaterally. MS: No deformity or atrophy. Skin: Warm and dry, no rash. Neuro:  Strength and sensation are intact. Psych: Normal affect.  Assessment & Plan    CAD Stable with no anginal symptoms. No indication for ischemic evaluation.  Continue aspirin, carvedilol, olmesartan-hydrochlorothiazide, and rosuvastatin. Heart healthy diet and regular cardiovascular exercise encouraged.   HTN Blood pressure stable.  Continue current medication regimen. Discussed to monitor BP at home at least 2 hours after medications and sitting for 5-10 minutes. Heart healthy diet and regular cardiovascular exercise encouraged.   HLD Repeat fasting lipid panel shows improvement in LDL, but still elevated at 136, triglycerides were 218, and total cholesterol 253.  Discussed/recommended PCSK9 inhibitor/lipid clinic, however patient declines and would like to work on lifestyle changes at this time. Heart healthy diet and regular cardiovascular exercise encouraged.   Hx of CVA Denies any symptoms.  No deficits noted.  Continue current medication regimen. Continue to follow with PCP.  Obesity BMI 33.29. Weight loss via diet and exercise encouraged. Discussed the impact being overweight would have on cardiovascular risk.  Currently declines PREP program referral, stated she will be increasing her physical activity.  Disposition: Follow up in 6 month(s) with Carlyle Dolly, MD or APP.  Signed, Finis Bud, NP 02/09/2023, 2:27 PM [place]

## 2023-02-09 NOTE — Patient Instructions (Signed)
Medication Instructions:  Continue all current medications.   Labwork: none  Testing/Procedures: none  Follow-Up: 6 months   Any Other Special Instructions Will Be Listed Below (If Applicable).   If you need a refill on your cardiac medications before your next appointment, please call your pharmacy.  

## 2023-02-20 DIAGNOSIS — M79672 Pain in left foot: Secondary | ICD-10-CM | POA: Diagnosis not present

## 2023-02-20 DIAGNOSIS — M792 Neuralgia and neuritis, unspecified: Secondary | ICD-10-CM | POA: Diagnosis not present

## 2023-03-09 DIAGNOSIS — M9902 Segmental and somatic dysfunction of thoracic region: Secondary | ICD-10-CM | POA: Diagnosis not present

## 2023-03-09 DIAGNOSIS — M9905 Segmental and somatic dysfunction of pelvic region: Secondary | ICD-10-CM | POA: Diagnosis not present

## 2023-03-09 DIAGNOSIS — M5441 Lumbago with sciatica, right side: Secondary | ICD-10-CM | POA: Diagnosis not present

## 2023-03-09 DIAGNOSIS — M9903 Segmental and somatic dysfunction of lumbar region: Secondary | ICD-10-CM | POA: Diagnosis not present

## 2023-03-12 DIAGNOSIS — M9902 Segmental and somatic dysfunction of thoracic region: Secondary | ICD-10-CM | POA: Diagnosis not present

## 2023-03-12 DIAGNOSIS — M9903 Segmental and somatic dysfunction of lumbar region: Secondary | ICD-10-CM | POA: Diagnosis not present

## 2023-03-12 DIAGNOSIS — M5441 Lumbago with sciatica, right side: Secondary | ICD-10-CM | POA: Diagnosis not present

## 2023-03-12 DIAGNOSIS — M9905 Segmental and somatic dysfunction of pelvic region: Secondary | ICD-10-CM | POA: Diagnosis not present

## 2023-03-16 DIAGNOSIS — M9903 Segmental and somatic dysfunction of lumbar region: Secondary | ICD-10-CM | POA: Diagnosis not present

## 2023-03-16 DIAGNOSIS — M9905 Segmental and somatic dysfunction of pelvic region: Secondary | ICD-10-CM | POA: Diagnosis not present

## 2023-03-16 DIAGNOSIS — M5441 Lumbago with sciatica, right side: Secondary | ICD-10-CM | POA: Diagnosis not present

## 2023-03-16 DIAGNOSIS — M9902 Segmental and somatic dysfunction of thoracic region: Secondary | ICD-10-CM | POA: Diagnosis not present

## 2023-03-21 DIAGNOSIS — M9905 Segmental and somatic dysfunction of pelvic region: Secondary | ICD-10-CM | POA: Diagnosis not present

## 2023-03-21 DIAGNOSIS — M5441 Lumbago with sciatica, right side: Secondary | ICD-10-CM | POA: Diagnosis not present

## 2023-03-21 DIAGNOSIS — M9902 Segmental and somatic dysfunction of thoracic region: Secondary | ICD-10-CM | POA: Diagnosis not present

## 2023-03-21 DIAGNOSIS — M9903 Segmental and somatic dysfunction of lumbar region: Secondary | ICD-10-CM | POA: Diagnosis not present

## 2023-03-23 DIAGNOSIS — M9905 Segmental and somatic dysfunction of pelvic region: Secondary | ICD-10-CM | POA: Diagnosis not present

## 2023-03-23 DIAGNOSIS — M5441 Lumbago with sciatica, right side: Secondary | ICD-10-CM | POA: Diagnosis not present

## 2023-03-23 DIAGNOSIS — M9902 Segmental and somatic dysfunction of thoracic region: Secondary | ICD-10-CM | POA: Diagnosis not present

## 2023-03-23 DIAGNOSIS — M9903 Segmental and somatic dysfunction of lumbar region: Secondary | ICD-10-CM | POA: Diagnosis not present

## 2023-03-29 DIAGNOSIS — M9905 Segmental and somatic dysfunction of pelvic region: Secondary | ICD-10-CM | POA: Diagnosis not present

## 2023-03-29 DIAGNOSIS — M5441 Lumbago with sciatica, right side: Secondary | ICD-10-CM | POA: Diagnosis not present

## 2023-03-29 DIAGNOSIS — M9902 Segmental and somatic dysfunction of thoracic region: Secondary | ICD-10-CM | POA: Diagnosis not present

## 2023-03-29 DIAGNOSIS — M9903 Segmental and somatic dysfunction of lumbar region: Secondary | ICD-10-CM | POA: Diagnosis not present

## 2023-04-04 DIAGNOSIS — M9902 Segmental and somatic dysfunction of thoracic region: Secondary | ICD-10-CM | POA: Diagnosis not present

## 2023-04-04 DIAGNOSIS — M9905 Segmental and somatic dysfunction of pelvic region: Secondary | ICD-10-CM | POA: Diagnosis not present

## 2023-04-04 DIAGNOSIS — M5441 Lumbago with sciatica, right side: Secondary | ICD-10-CM | POA: Diagnosis not present

## 2023-04-04 DIAGNOSIS — M9903 Segmental and somatic dysfunction of lumbar region: Secondary | ICD-10-CM | POA: Diagnosis not present

## 2023-04-11 DIAGNOSIS — M9903 Segmental and somatic dysfunction of lumbar region: Secondary | ICD-10-CM | POA: Diagnosis not present

## 2023-04-11 DIAGNOSIS — M9905 Segmental and somatic dysfunction of pelvic region: Secondary | ICD-10-CM | POA: Diagnosis not present

## 2023-04-11 DIAGNOSIS — M9902 Segmental and somatic dysfunction of thoracic region: Secondary | ICD-10-CM | POA: Diagnosis not present

## 2023-04-11 DIAGNOSIS — M5441 Lumbago with sciatica, right side: Secondary | ICD-10-CM | POA: Diagnosis not present

## 2023-04-25 DIAGNOSIS — M9902 Segmental and somatic dysfunction of thoracic region: Secondary | ICD-10-CM | POA: Diagnosis not present

## 2023-04-25 DIAGNOSIS — M9903 Segmental and somatic dysfunction of lumbar region: Secondary | ICD-10-CM | POA: Diagnosis not present

## 2023-04-25 DIAGNOSIS — M5441 Lumbago with sciatica, right side: Secondary | ICD-10-CM | POA: Diagnosis not present

## 2023-04-25 DIAGNOSIS — M9905 Segmental and somatic dysfunction of pelvic region: Secondary | ICD-10-CM | POA: Diagnosis not present

## 2023-05-23 DIAGNOSIS — M9905 Segmental and somatic dysfunction of pelvic region: Secondary | ICD-10-CM | POA: Diagnosis not present

## 2023-05-23 DIAGNOSIS — M9902 Segmental and somatic dysfunction of thoracic region: Secondary | ICD-10-CM | POA: Diagnosis not present

## 2023-05-23 DIAGNOSIS — M9903 Segmental and somatic dysfunction of lumbar region: Secondary | ICD-10-CM | POA: Diagnosis not present

## 2023-05-23 DIAGNOSIS — M5441 Lumbago with sciatica, right side: Secondary | ICD-10-CM | POA: Diagnosis not present

## 2023-06-19 DIAGNOSIS — E1159 Type 2 diabetes mellitus with other circulatory complications: Secondary | ICD-10-CM | POA: Diagnosis not present

## 2023-06-19 DIAGNOSIS — E876 Hypokalemia: Secondary | ICD-10-CM | POA: Diagnosis not present

## 2023-06-19 DIAGNOSIS — E782 Mixed hyperlipidemia: Secondary | ICD-10-CM | POA: Diagnosis not present

## 2023-06-19 DIAGNOSIS — I10 Essential (primary) hypertension: Secondary | ICD-10-CM | POA: Diagnosis not present

## 2023-06-19 DIAGNOSIS — I2510 Atherosclerotic heart disease of native coronary artery without angina pectoris: Secondary | ICD-10-CM | POA: Diagnosis not present

## 2023-06-19 DIAGNOSIS — E039 Hypothyroidism, unspecified: Secondary | ICD-10-CM | POA: Diagnosis not present

## 2023-06-19 DIAGNOSIS — E7849 Other hyperlipidemia: Secondary | ICD-10-CM | POA: Diagnosis not present

## 2023-06-20 DIAGNOSIS — M5441 Lumbago with sciatica, right side: Secondary | ICD-10-CM | POA: Diagnosis not present

## 2023-06-20 DIAGNOSIS — M9903 Segmental and somatic dysfunction of lumbar region: Secondary | ICD-10-CM | POA: Diagnosis not present

## 2023-06-20 DIAGNOSIS — M9902 Segmental and somatic dysfunction of thoracic region: Secondary | ICD-10-CM | POA: Diagnosis not present

## 2023-06-20 DIAGNOSIS — M9905 Segmental and somatic dysfunction of pelvic region: Secondary | ICD-10-CM | POA: Diagnosis not present

## 2023-08-02 ENCOUNTER — Other Ambulatory Visit (HOSPITAL_COMMUNITY): Payer: Self-pay | Admitting: Family Medicine

## 2023-08-02 DIAGNOSIS — Z1231 Encounter for screening mammogram for malignant neoplasm of breast: Secondary | ICD-10-CM

## 2023-08-24 ENCOUNTER — Ambulatory Visit (HOSPITAL_COMMUNITY)
Admission: RE | Admit: 2023-08-24 | Discharge: 2023-08-24 | Disposition: A | Discharge status: Home / Self Care | Payer: Medicare Other | Source: Ambulatory Visit | Attending: Family Medicine | Admitting: Family Medicine

## 2023-08-24 DIAGNOSIS — Z1231 Encounter for screening mammogram for malignant neoplasm of breast: Secondary | ICD-10-CM | POA: Diagnosis not present

## 2023-08-27 ENCOUNTER — Ambulatory Visit (HOSPITAL_COMMUNITY): Payer: Medicare Other

## 2023-10-22 DIAGNOSIS — M79672 Pain in left foot: Secondary | ICD-10-CM | POA: Diagnosis not present

## 2023-10-22 DIAGNOSIS — G629 Polyneuropathy, unspecified: Secondary | ICD-10-CM | POA: Diagnosis not present

## 2023-10-22 DIAGNOSIS — I10 Essential (primary) hypertension: Secondary | ICD-10-CM | POA: Diagnosis not present

## 2023-10-22 DIAGNOSIS — E782 Mixed hyperlipidemia: Secondary | ICD-10-CM | POA: Diagnosis not present

## 2023-10-22 DIAGNOSIS — E039 Hypothyroidism, unspecified: Secondary | ICD-10-CM | POA: Diagnosis not present

## 2023-10-22 DIAGNOSIS — E1159 Type 2 diabetes mellitus with other circulatory complications: Secondary | ICD-10-CM | POA: Diagnosis not present

## 2023-11-26 DIAGNOSIS — E1159 Type 2 diabetes mellitus with other circulatory complications: Secondary | ICD-10-CM | POA: Diagnosis not present

## 2023-11-26 DIAGNOSIS — E7849 Other hyperlipidemia: Secondary | ICD-10-CM | POA: Diagnosis not present

## 2023-11-26 DIAGNOSIS — E039 Hypothyroidism, unspecified: Secondary | ICD-10-CM | POA: Diagnosis not present

## 2023-11-26 DIAGNOSIS — Z0001 Encounter for general adult medical examination with abnormal findings: Secondary | ICD-10-CM | POA: Diagnosis not present

## 2024-01-02 DIAGNOSIS — E119 Type 2 diabetes mellitus without complications: Secondary | ICD-10-CM | POA: Diagnosis not present

## 2024-06-22 NOTE — Progress Notes (Unsigned)
 Cardiology Office Note    Date:  06/26/2024  ID:  Lisa Griffith, DOB 09/02/1946, MRN [id number] Cardiologist: Alvan Carrier, MD Cardiology APP:  Johnson Laymon HERO, PA-C { :  History of Present Illness:    Lisa Griffith is a 78 y.o. female with past medical history of CAD (catheterization in 2010 showing 70% ramus stenosis with medical management recommended, low risk NST in 2016), HLD, HTN and history of CVA who presents to the office today for overdue 10-month follow-up.  She was last examined by Lisa Crate, NP in 01/2023 and reported overall feeling well at that time and denied any recent chest pain or dyspnea on exertion. Most recent LDL was still elevated at 863 and the option of PCSK9 inhibitor therapy was reviewed and she declined at that time and wished to focus on lifestyle modification. She was continued on ASA 81 mg daily, Coreg  3.125 mg twice daily, Olmesartan-HCTZ 40-25 mg daily and Crestor  10 mg MWF.   In talking with the patient today, she reports overall feeling well since her last office visit. She remains active in doing routine chores around her home and denies any recent chest pain or dyspnea on exertion with this. No specific orthopnea, PND, pitting edema or palpitations. Says her blood pressure is elevated when checked at home and BP was initially at 146/70 during today's visit, rechecked and at 132/78. She has been intolerant to multiple statins. Reports consuming gravy frequently and does not follow a heart healthy diet.  Studies Reviewed:   EKG: EKG is ordered today and demonstrates:   EKG Interpretation Date/Time:  Thursday June 26 2024 11:27:25 EDT Ventricular Rate:  63 PR Interval:  218 QRS Duration:  70 QT Interval:  420 QTC Calculation: 429 R Axis:   64  Text Interpretation: Sinus rhythm with 1st degree A-V block When compared with ECG of 24-Nov-2021 22:42, PREVIOUS ECG IS PRESENT No acute ST changes. Confirmed by Johnson Laymon  ([id number]) on 06/26/2024 11:28:48 AM       NST: 01/2015 IMPRESSION: 1. No reversible ischemia or infarction.   2. Normal left ventricular wall motion.   3. Left ventricular ejection fraction 87%   4. Low-risk stress test findings*.   *2012 Appropriate Use Criteria for Coronary Revascularization Focused Update: J Am Coll Cardiol. 2012;59(9):857-881. http://content.dementiazones.com.aspx?articleid=1201161   Physical Exam:   VS:  BP 132/78   Pulse 66   Ht 5' 2 (1.575 m)   Wt 175 lb (79.4 kg)   SpO2 96%   BMI 32.01 kg/m    Wt Readings from Last 3 Encounters:  06/26/24 175 lb (79.4 kg)  02/09/23 182 lb (82.6 kg)  08/16/22 184 lb 9.6 oz (83.7 kg)     GEN: Well nourished, well developed female appearing in no acute distress NECK: No JVD; No carotid bruits CARDIAC: RRR, no murmurs, rubs, gallops RESPIRATORY:  Clear to auscultation without rales, wheezing or rhonchi  ABDOMEN: Appears non-distended. No obvious abdominal masses. EXTREMITIES: No clubbing or cyanosis. No pitting edema.  Distal pedal pulses are 2+ bilaterally.   Assessment and Plan:   1. Coronary artery disease involving native heart without angina pectoris, unspecified vessel or lesion type - Prior catheterization in 2010 showed 70% ramus stenosis with medical management recommended. Most recent ischemic evaluation was a low-risk NST in 2016.  - She remains active at baseline and denies any recent anginal symptoms. Continue current medical therapy with ASA 81 mg daily, Coreg  6.25 mg twice daily and Olmesartan-HCTZ 40-25 mg daily. Will  start Zetia  as outlined below since she has been intolerant to statins.  2. Essential hypertension - BP is well-controlled during today's visit, at 132/78 on recheck. Continue Coreg  6.25 mg twice daily and Olmesartan-HCTZ 40-25 mg daily.  3. Hyperlipidemia LDL goal <70 - LDL was at 860 in 10/2023 by review of [facility] DXA. She was previously intolerant to Pravastatin , Crestor   and Atorvastatin. We reviewed options in regards to Zetia , PCSK9 inhibitor therapy or Leqvio. She wishes to try Zetia  initially and will prescribe Zetia  10 mg daily.  Will recheck an FLP and LFT's in 2 to 3 months. If intolerant to Zetia  or her LDL remains above goal, would refer to the [facility] for consideration of PCSK9 inhibitor therapy or Leqvio (she is open to either pending insurance coverage but wants to try PO medications initially).   Signed, Laymon CHRISTELLA Qua, PA-C

## 2024-06-26 ENCOUNTER — Encounter: Payer: Self-pay | Admitting: Student

## 2024-06-26 ENCOUNTER — Ambulatory Visit: Attending: Student | Admitting: Student

## 2024-06-26 VITALS — BP 132/78 | HR 66 | Ht 62.0 in | Wt 175.0 lb

## 2024-06-26 DIAGNOSIS — E785 Hyperlipidemia, unspecified: Secondary | ICD-10-CM

## 2024-06-26 DIAGNOSIS — I10 Essential (primary) hypertension: Secondary | ICD-10-CM | POA: Diagnosis not present

## 2024-06-26 DIAGNOSIS — I2510 Atherosclerotic heart disease of native coronary artery without angina pectoris: Secondary | ICD-10-CM | POA: Diagnosis not present

## 2024-06-26 MED ORDER — EZETIMIBE 10 MG PO TABS: 10.0000 mg | ORAL_TABLET | Freq: Every day | ORAL | 3 refills | Status: DC

## 2024-06-26 NOTE — Patient Instructions (Signed)
 Medication Instructions:  Your physician has recommended you make the following change in your  medication:   Start Zetia  10 mg Daily   *If you need a refill on your cardiac medications before your next appointment, please call your pharmacy*  Lab Work: Your physician recommends that you return for lab work in: 2-3 months Fasting ( Liver, Lipid)   If you have labs (blood work) drawn today and your tests are completely normal, you will receive your results only by: MyChart Message (if you have MyChart) OR A paper copy in the mail If you have any lab test that is abnormal or we need to change your treatment, we will call you to review the results.  Testing/Procedures: NONE    Follow-Up: At [facility], you and your health needs are our priority.  As part of our continuing mission to provide you with exceptional heart care, our providers are all part of one team.  This team includes your primary Cardiologist (physician) and Advanced Practice Providers or APPs (Physician Assistants and Nurse Practitioners) who all work together to provide you with the care you need, when you need it.  Your next appointment:   6 month(s)  Provider:   You may see Alvan Carrier, MD or one of the following Advanced Practice Providers on your designated Care Team:   Laymon Qua, PA-C  [place], [place] Olivia [place], [place]     We recommend signing up for the patient portal called MyChart.  Sign up information is provided on this After Visit Summary.  MyChart is used to connect with patients for Virtual Visits (Telemedicine).  Patients are able to view lab/test results, encounter notes, upcoming appointments, etc.  Non-urgent messages can be sent to your provider as well.   To learn more about what you can do with MyChart, go to [email or web address].   Other Instructions Thank you for choosing [facility]!

## 2024-07-16 ENCOUNTER — Other Ambulatory Visit (HOSPITAL_COMMUNITY): Payer: Self-pay | Admitting: Family Medicine

## 2024-07-16 DIAGNOSIS — Z1231 Encounter for screening mammogram for malignant neoplasm of breast: Secondary | ICD-10-CM

## 2024-08-25 ENCOUNTER — Ambulatory Visit (HOSPITAL_COMMUNITY)
Admission: RE | Admit: 2024-08-25 | Discharge: 2024-08-25 | Disposition: A | Discharge status: Home / Self Care | Source: Ambulatory Visit | Attending: Family Medicine | Admitting: Family Medicine

## 2024-08-25 DIAGNOSIS — Z1231 Encounter for screening mammogram for malignant neoplasm of breast: Secondary | ICD-10-CM | POA: Diagnosis not present

## 2024-08-27 ENCOUNTER — Ambulatory Visit (INDEPENDENT_AMBULATORY_CARE_PROVIDER_SITE_OTHER): Admitting: Podiatry

## 2024-08-27 ENCOUNTER — Ambulatory Visit (INDEPENDENT_AMBULATORY_CARE_PROVIDER_SITE_OTHER)

## 2024-08-27 ENCOUNTER — Encounter: Payer: Self-pay | Admitting: Podiatry

## 2024-08-27 VITALS — Ht 62.0 in | Wt 175.0 lb

## 2024-08-27 DIAGNOSIS — M5416 Radiculopathy, lumbar region: Secondary | ICD-10-CM

## 2024-08-27 DIAGNOSIS — M7752 Other enthesopathy of left foot: Secondary | ICD-10-CM | POA: Diagnosis not present

## 2024-08-27 NOTE — Progress Notes (Signed)
 Chief Complaint  Patient presents with   Foot Pain    Pt is here due to left foot pain, states the 2nd-5th toes bothers her and this has been going on for quite a while, thinks she has nerve damage in the foot, says she can't control the movement of her toes.    HPI: 78 y.o. female presenting today for evaluation of paresthesia with numbness and pins and needle sensation to the left lower extremity.  Patient states that she gets continuous pins-and-needles and paresthesia to the left foot.  She also has a longstanding history of lumbar pathology  Past Medical History:  Diagnosis Date   Back muscle spasm    Constipation    Coronary artery disease    a. catheterization in 2010 showing 70% ramus stenosis with medical management recommended b. low risk NST in 2016   GERD (gastroesophageal reflux disease)    Headache(784.0)    Hypertension    Ruptured lumbar disc    Seasonal allergies    Stroke (HCC) 11/27/2008   mild     Past Surgical History:  Procedure Laterality Date   CARDIAC CATHETERIZATION  12/2008   [facility]   CATARACT EXTRACTION, BILATERAL  2008   [facility]   COLONOSCOPY  01/16/2012   Procedure: COLONOSCOPY;  Surgeon: Oneil DELENA Budge, MD;  Location: [facility]ITE;  Service: Gastroenterology;  Laterality: N/A;   COLONOSCOPY     EYE SURGERY Bilateral    cataract   LUMBAR LAMINECTOMY/DECOMPRESSION MICRODISCECTOMY Right 03/01/2015   Procedure: Right L1 Hemilaminectomy, Foraminotomy;  Surgeon: Oneil JAYSON Herald, MD;  Location: [facility] OR;  Service: Orthopedics;  Laterality: Right;   POSTERIOR LAMINECTOMY / DECOMPRESSION CERVICAL SPINE     [facility]    Allergies  Allergen Reactions   Hydrocodone Other (See Comments)    Hallucinations   Pravastatin      Constipation   Statins     Pt unsure of reaction      Physical Exam: General: The patient is alert and oriented x3 in no acute distress.  Dermatology: Skin is warm, dry and supple bilateral lower extremities.   Vascular:  Palpable pedal pulses bilaterally. Capillary refill within normal limits.  No appreciable edema.  No erythema.  Neurological: Paresthesia with light touch extending to the left lower extremity  Musculoskeletal Exam: Mild bunion deformity noted  Radiographic Exam LT foot 08/27/2024:  Normal osseous mineralization. Joint spaces preserved.  No fractures or osseous irregularities noted.  Assessment/Plan of Care: 1.  Lumbar radiculopathy left lower extremity 2.  History of lumbar surgery  -Patient evaluated.  X-rays reviewed -Patient has persistent paresthesia with numbness and tingling to the left lower extremity.  Also long history of chronic lumbar pathology.  I do believe the patient's neuropathic type pain is coming from her lumbar spine -Referral placed to [facility] pain clinic, Dr. Wallie Sherry, to discuss different treatment options which may benefit the patient and possible spinal cord stimulator -Return to clinic in 3 PRN       Thresa EMERSON Sar, DPM [facility]  Dr. Thresa EMERSON Sar, DPM    [address] [place], [place] [address]                Office [phone]  Fax [phone]

## 2024-09-02 ENCOUNTER — Telehealth: Payer: Self-pay | Admitting: Podiatry

## 2024-09-02 NOTE — Telephone Encounter (Signed)
 Patient would like prescription for prednisone  sent to the [facility] in [place] on [address]. She also says that she doesn't want the referral for the pain clinic.

## 2024-09-04 ENCOUNTER — Other Ambulatory Visit: Payer: Self-pay | Admitting: Podiatry

## 2024-09-04 MED ORDER — METHYLPREDNISOLONE 4 MG PO TBPK: ORAL_TABLET | ORAL | 0 refills | Status: AC

## 2024-09-21 ENCOUNTER — Other Ambulatory Visit: Payer: Self-pay | Admitting: Student

## 2024-11-21 ENCOUNTER — Encounter (HOSPITAL_COMMUNITY): Payer: Self-pay

## 2024-11-21 ENCOUNTER — Other Ambulatory Visit: Payer: Self-pay

## 2024-11-21 ENCOUNTER — Emergency Department (HOSPITAL_COMMUNITY)
Admission: EM | Admit: 2024-11-21 | Discharge: 2024-11-21 | Disposition: A | Discharge status: Home / Self Care | Attending: Emergency Medicine | Admitting: Emergency Medicine

## 2024-11-21 ENCOUNTER — Emergency Department (HOSPITAL_COMMUNITY)

## 2024-11-21 DIAGNOSIS — Z7982 Long term (current) use of aspirin: Secondary | ICD-10-CM | POA: Diagnosis not present

## 2024-11-21 DIAGNOSIS — M25552 Pain in left hip: Secondary | ICD-10-CM | POA: Diagnosis present

## 2024-11-21 DIAGNOSIS — M76892 Other specified enthesopathies of left lower limb, excluding foot: Secondary | ICD-10-CM | POA: Diagnosis not present

## 2024-11-21 DIAGNOSIS — T148XXA Other injury of unspecified body region, initial encounter: Secondary | ICD-10-CM | POA: Diagnosis not present

## 2024-11-21 DIAGNOSIS — W1839XA Other fall on same level, initial encounter: Secondary | ICD-10-CM | POA: Insufficient documentation

## 2024-11-21 DIAGNOSIS — M51361 Other intervertebral disc degeneration, lumbar region with lower extremity pain only: Secondary | ICD-10-CM | POA: Diagnosis not present

## 2024-11-21 DIAGNOSIS — I10 Essential (primary) hypertension: Secondary | ICD-10-CM | POA: Insufficient documentation

## 2024-11-21 DIAGNOSIS — R030 Elevated blood-pressure reading, without diagnosis of hypertension: Secondary | ICD-10-CM

## 2024-11-21 MED ORDER — TRAMADOL HCL 50 MG PO TABS: 50.0000 mg | ORAL_TABLET | Freq: Four times a day (QID) | ORAL | 0 refills | Status: AC | PRN

## 2024-11-21 MED ORDER — TRAMADOL HCL 50 MG PO TABS
50.0000 mg | ORAL_TABLET | Freq: Once | ORAL | Status: AC
  Administered 2024-11-21: 50 mg via ORAL
  Filled 2024-11-21: qty 1

## 2024-11-21 MED ORDER — ACETAMINOPHEN 500 MG PO TABS
1000.0000 mg | ORAL_TABLET | Freq: Once | ORAL | Status: AC
  Administered 2024-11-21: 1000 mg via ORAL
  Filled 2024-11-21: qty 2

## 2024-11-21 MED ORDER — METHOCARBAMOL 500 MG PO TABS: 500.0000 mg | ORAL_TABLET | Freq: Three times a day (TID) | ORAL | 0 refills | Status: AC | PRN

## 2024-11-21 NOTE — ED Notes (Addendum)
Pt transported back from Xray 

## 2024-11-21 NOTE — Discharge Instructions (Addendum)
 It was our pleasure to provide your ER care today - we hope that you feel better.  Your imaging tests were read as showing:  1. Low-grade partial-thickness undersurface tears of the left gluteus medius & minimus cuff insertions with tendinosis. 2. Mild osteoarthritis of the left hip. 1. Multilevel degenerative change,most pronounced at L3-4 where there is moderate spinal canal stenosis. 2. At L1-2, moderate right lateral recess narrowing with mass effect on the traversing right L2 nerve root and severe right neural foraminal narrowing. 3. At L2-3, moderate right subarticular zone narrowing with mass effect on the traversing right L3 nerve root and moderate to severe right neural foraminal narrowing. 4. At L4-5, moderate left subarticular zone narrowing with mass effect on the  traversing left L5 nerve root and moderate left neural foraminal narrowing.   Avoid bending at waist or heavy lifting > 20 lbs for the next week.  Try gentle massage and/or heat  therapy to sore area. Take acetaminophen  or ibuprofen as need for pain. You may also take robaxin  as need for muscle pain/spasm, and ultram  as need for pain -  no driving when taking these meds.   Follow up with your primary care doctor and orthopedist in the next couple weeks, have them review your MRIs (and have them cancel the scheduled MRI).  Also follow up with your doctor for your blood pressure that is high today.   Return to ER if worse, new symptoms, fevers, severe/intractable pain, numbness/weakness, or other concern.

## 2024-11-21 NOTE — ED Notes (Signed)
 Patient transported to MRI

## 2024-11-21 NOTE — ED Provider Notes (Signed)
 " [place] EMERGENCY DEPARTMENT AT [facility] Provider Note   CSN: [id number] Arrival date & time: 11/21/24  9191     Patient presents with: No chief complaint on file.   Lisa Griffith is a 78 y.o. female.   Patient c/o pain to lateral aspect of left hip in the past couple months. States had a mechanical fall in October, and has had pain to area since. Sharp pain, localized to lateral aspect hip, worse w walking and weight bearing. No radicular pain down leg. No leg numbness/weakness. Has been to do ADLs (going out shopping, cooking, etc.) however w pain. Hx lower back pain, ?hx ddd. Indicates has [facility] w [facility] - was given injection and some pain medication but symptoms persist.  No fever or chills. No hip or leg swelling.   The history is provided by the patient, medical records and a relative.       Prior to Admission medications  Medication Sig Start Date End Date Taking? Authorizing Provider  methocarbamol  (ROBAXIN ) 500 MG tablet Take 1 tablet (500 mg total) by mouth 3 (three) times daily as needed (muscle spasm/pain). 11/21/24  Yes Bernard Drivers, MD  traMADol  (ULTRAM ) 50 MG tablet Take 1 tablet (50 mg total) by mouth every 6 (six) hours as needed. 11/21/24  Yes Bernard Drivers, MD  aspirin  81 MG tablet Take 81 mg by mouth daily.    [provider]  carvedilol  (COREG ) 6.25 MG tablet Take 6.25 mg by mouth 2 (two) times daily. 04/28/24   [provider]  ezetimibe  (ZETIA ) 10 MG tablet TAKE 1 TABLET BY MOUTH EVERY DAY 09/22/24   Strader, Brittany M, PA-C  metFORMIN (GLUCOPHAGE) 1000 MG tablet Take 500 mg by mouth 2 (two) times daily with a meal.    [provider]  methylPREDNISolone  (MEDROL  DOSEPAK) 4 MG TBPK tablet 6 day dose pack - take as directed 09/04/24   Janit Thresa HERO, DPM  olmesartan-hydrochlorothiazide  (BENICAR HCT) 40-25 MG tablet Take 1 tablet by mouth daily. 01/01/23   [provider]    Allergies:  Hydrocodone, Pravastatin , and Statins    Review of Systems  Constitutional:  Negative for chills and fever.  Respiratory:  Negative for shortness of breath.   Cardiovascular:  Negative for chest pain.  Gastrointestinal:  Negative for abdominal pain.  Genitourinary:  Negative for dysuria, flank pain and hematuria.  Musculoskeletal:  Negative for neck pain.  Skin:  Negative for rash.  Neurological:  Negative for weakness and numbness.    Updated Vital Signs BP (!) 171/58 (BP Location: Left Arm)   Pulse 67   Temp 97.9 F (36.6 C) (Oral)   Resp 17   Ht 1.575 m (5' 2)   Wt 81.6 kg   SpO2 91%   BMI 32.92 kg/m   Physical Exam Vitals and nursing note reviewed.  Constitutional:      Appearance: Normal appearance. She is well-developed.  HENT:     Head: Atraumatic.     Nose: Nose normal.  Eyes:     General: No scleral icterus.    Conjunctiva/sclera: Conjunctivae normal.  Neck:     Trachea: No tracheal deviation.  Cardiovascular:     Rate and Rhythm: Normal rate.     Pulses: Normal pulses.  Pulmonary:     Effort: Pulmonary effort is normal. No respiratory distress.  Abdominal:     General: There is no distension.     Palpations: Abdomen is soft. There is no mass.  Tenderness: There is no abdominal tenderness.  Genitourinary:    Comments: No cva tenderness.  Musculoskeletal:        General: No swelling.     Cervical back: Neck supple. No muscular tenderness.     Comments: T/L/S spine non tender, aligned. Good passive rom left hip and knee without pain. LLE is of normal color and warmth, with intact distal pulses, no swelling.   Skin:    General: Skin is warm and dry.     Findings: No rash.  Neurological:     Mental Status: She is alert.     Comments: Alert, speech normal.  LLE nvi with intact motor/sens fxn. Stre 5/5.   Psychiatric:        Mood and Affect: Mood normal.     (all labs ordered are listed, but only abnormal results are displayed) Labs Reviewed - No  data to display  EKG: None  Radiology: MR HIP LEFT WO CONTRAST Result Date: 11/21/2024 CLINICAL DATA:  Chronic left hip pain. EXAM: MR OF THE LEFT HIP WITHOUT CONTRAST TECHNIQUE: Multiplanar, multisequence MR imaging was performed. No intravenous contrast was administered. COMPARISON:  Left hip radiographs dated 11/21/2024. FINDINGS: Bone No hip fracture, dislocation or avascular necrosis. Mild joint space narrowing and osteophytosis of the left hip. Similar-appearing relatively symmetric degenerative changes of the right hip. No aggressive osseous lesion. SI joints are within normal limits. No SI joint widening or erosive changes. Alignment Normal. No subluxation. Joint effusion No joint effusion. Labrum No labral tear identified. Cartilage Thinning and irregularity of the superior weight-bearing articular cartilage. Capsule and ligaments Normal. Muscles and Tendons Muscle bulk appears relatively symmetric bilaterally. No significant intramuscular edema. Low-grade partial-thickness undersurface tears of the left gluteus medius and minimus cuff insertions with tendinosis. Hamstring tendon origins are intact. Other Findings No bursal fluid. Viscera No acute abnormality seen in pelvis. No enlarged lymph nodes identified in the field of view. IMPRESSION: 1. Low-grade partial-thickness undersurface tears of the left gluteus medius and minimus cuff insertions with tendinosis. 2. Mild osteoarthritis of the left hip. Electronically Signed   By: Harrietta Sherry M.D.   On: 11/21/2024 12:30   MR LUMBAR SPINE WO CONTRAST Result Date: 11/21/2024 EXAM: MRI LUMBAR SPINE 11/21/2024 10:48:59 AM TECHNIQUE: Multiplanar multisequence MRI of the lumbar spine was performed without the administration of intravenous contrast. COMPARISON: MRI lumbar spine 01/05/2015. CLINICAL HISTORY: Low back pain, symptoms persist with > 6 wks treatment; ?radicular back pain. FINDINGS: BONES AND ALIGNMENT: Conventional lumbosacral anatomy is  assumed with 5 non-rib-bearing, lumbar-type vertebral bodies. Unchanged 5 mm retrolisthesis of L1 on L2. Unchanged chronic anterior compression fracture of the L1 vertebral body with mild height loss. Bone marrow signal is unremarkable. SPINAL CORD: The conus terminates at L1. SOFT TISSUES: Moderate fatty atrophy of the paraspinal muscles. No paraspinal mass. L1-L2: Right eccentric disc bulge and facet arthropathy result in moderate narrowing of the right lateral recess with mass effect on the traversing right L2 nerve root and severe right neural foraminal narrowing. L2-L3: Partially calcified disc bulge and right greater than left facet arthropathy result in moderate narrowing of the right subarticular zone with mass effect on the traversing right L3 nerve root. Moderate to severe right neural foraminal narrowing. L3-L4: Disc bulge and moderate bilateral facet arthropathy result in moderate spinal canal stenosis and moderate left neural foraminal narrowing. L4-L5: Disc bulge with superimposed central disc protrusion and left greater than right facet arthropathy result in moderate narrowing of the left subarticular zone with mass  effect on the traversing left L5 nerve root. Moderate left neural foraminal narrowing. L5-S1: Small disc bulge without spinal canal stenosis. Moderate bilateral facet arthropathy contributes to moderate bilateral neural foraminal narrowing. IMPRESSION: 1. Multilevel degenerative change, most pronounced at L3-4 where there is moderate spinal canal stenosis. 2. At L1-2, moderate right lateral recess narrowing with mass effect on the traversing right L2 nerve root and severe right neural foraminal narrowing. 3. At L2-3, moderate right subarticular zone narrowing with mass effect on the traversing right L3 nerve root and moderate to severe right neural foraminal narrowing. 4. At L4-5, moderate left subarticular zone narrowing with mass effect on the traversing left L5 nerve root and moderate  left neural foraminal narrowing. Electronically signed by: Ryan Chess MD 11/21/2024 10:59 AM EST RP Workstation: [id number]   DG Lumbar Spine Complete Result Date: 11/21/2024 EXAM: 4 OR MORE VIEW(S) XRAY OF THE LUMBAR SPINE 11/21/2024 09:35:33 AM COMPARISON: Lumbar radiographs 03/01/2015 and earlier. CLINICAL HISTORY: 78 year old female fall in October, persistent pain to left hip. FINDINGS: LUMBAR SPINE: BONES: Normal lumbar segmentation. Chronic L1 compression fracture is stable. T12 chronic superior endplate Schmorl node is stable. Grossly intact visible lower thoracic levels. Mild chronic levoconvex lumbar scoliosis. Mild grade 1 anterolisthesis of L3 on L4 appears chronic and stable. DISCS AND DEGENERATIVE CHANGES: Chronic severe L1-L2 disc space loss with vacuum disc, endplate spurring and sclerosis is chronic but progressed since 2016. advanced chronic lumbar facet hypertrophy throughout. SOFT TISSUES: Calcified abdominal aortic atherosclerosis. Negative visible lung bases. Bowel gas pattern is unremarkable. IMPRESSION: 1. No acute osseous abnormality identified in the lumbar spine. 2. Chronic lumbar spine degeneration with some progression since 2016. Chronic L1 compression fracture. Electronically signed by: Helayne Hurst MD 11/21/2024 09:56 AM EST RP Workstation: [id number]   DG HIP UNILAT W OR W/O PELVIS 2-3 VIEWS LEFT Result Date: 11/21/2024 EXAM: 2 or more VIEW(S) XRAY OF THE LEFT HIP 11/21/2024 09:35:33 AM COMPARISON: Sacral x-ray 01/25/2005. CLINICAL HISTORY: 78 year old female with fall in October, persistent pain to left hip with ongoing symptoms. FINDINGS: BONES AND JOINTS: No acute fracture. No malalignment. Mild degenerative changes of bilateral hips with mild joint space narrowing and osteophyte formation. LUMBAR SPINE: Degenerative changes in lower lumbar spine. SOFT TISSUES: External zipper artifact incidentally noted. The soft tissues are unremarkable. IMPRESSION: 1. No acute  osseous abnormality identified about the left hip or pelvis. Electronically signed by: Helayne Hurst MD 11/21/2024 09:53 AM EST RP Workstation: [id number]     Procedures   Medications Ordered in the ED  traMADol  (ULTRAM ) tablet 50 mg (50 mg Oral Given 11/21/24 0851)  acetaminophen  (TYLENOL ) tablet 1,000 mg (1,000 mg Oral Given 11/21/24 0851)                                    Medical Decision Making Problems Addressed: Degeneration of intervertebral disc of lumbar region with lower extremity pain: chronic illness or injury with exacerbation, progression, or side effects of treatment that poses a threat to life or bodily functions Elevated blood pressure reading: acute illness or injury Essential hypertension: chronic illness or injury with exacerbation, progression, or side effects of treatment that poses a threat to life or bodily functions Left hip pain: acute illness or injury    Details: Acute/chronic Muscle tear: acute illness or injury    Details: Acute/chronic Tendonitis of left hip: acute illness or injury  Amount and/or Complexity of Data Reviewed Independent  Historian:     Details: Family, hx External Data Reviewed: notes. Radiology: ordered and independent interpretation performed. Decision-making details documented in ED Course.  Risk OTC drugs. Prescription drug management.   Imaging ordered.   Reviewed nursing notes and prior charts for additional history.  No prior imaging results noted in EPIC.   Ultram  for pain.   Xrays reviewed/interpreted by me - no fx.   Pt/family inquiring about getting MRI here.  MRI completed.   MRI reviewed/interpreted by me - +partial glut m/m tear, tendonosis, ddd. Discussed w pt/family.  Pt appears stable for ed d/c.  Rec close pcp/ortho f/u.  Return precautions provided.          Final diagnoses:  Left hip pain  Muscle tear  Tendonitis of left hip  Degeneration of intervertebral disc of lumbar region with lower  extremity pain  Elevated blood pressure reading  Essential hypertension    ED Discharge Orders          Ordered    methocarbamol  (ROBAXIN ) 500 MG tablet  3 times daily PRN        11/21/24 1305    traMADol  (ULTRAM ) 50 MG tablet  Every 6 hours PRN        11/21/24 1305               Bernard Drivers, MD 11/21/24 1307  "

## 2024-11-21 NOTE — ED Notes (Signed)
 Pt transported to Xray.

## 2024-11-21 NOTE — ED Triage Notes (Addendum)
 Patient come in PO V from home for complaint of hip pain, had been seeing a chiropractor and PCP for the pain and no change stated has an MRI scheduled for the 8th of January. Has been receiving shot to the left hip with little relief, and has lose of feeling in the left knee. [facility] did [facility] and told it was arthritis. Patient stated she took 2 alives and tylenol  this morning at 06:30.
